# Patient Record
Sex: Female | Born: 1960
Health system: Southern US, Community
[De-identification: ages and names within clinical notes are randomized; demographics above are authoritative.]

## PROBLEM LIST (undated history)

## (undated) DIAGNOSIS — R002 Palpitations: Secondary | ICD-10-CM

## (undated) DIAGNOSIS — Z83719 Family history of colon polyps, unspecified: Secondary | ICD-10-CM

## (undated) DIAGNOSIS — E7800 Pure hypercholesterolemia, unspecified: Secondary | ICD-10-CM

## (undated) DIAGNOSIS — H409 Unspecified glaucoma: Secondary | ICD-10-CM

## (undated) DIAGNOSIS — I10 Essential (primary) hypertension: Secondary | ICD-10-CM

## (undated) DIAGNOSIS — M545 Low back pain, unspecified: Secondary | ICD-10-CM

## (undated) DIAGNOSIS — B001 Herpesviral vesicular dermatitis: Secondary | ICD-10-CM

## (undated) DIAGNOSIS — I493 Ventricular premature depolarization: Secondary | ICD-10-CM

## (undated) DIAGNOSIS — H40059 Ocular hypertension, unspecified eye: Secondary | ICD-10-CM

## (undated) DIAGNOSIS — K5900 Constipation, unspecified: Secondary | ICD-10-CM

## (undated) DIAGNOSIS — A630 Anogenital (venereal) warts: Secondary | ICD-10-CM

## (undated) DIAGNOSIS — E119 Type 2 diabetes mellitus without complications: Secondary | ICD-10-CM

## (undated) DIAGNOSIS — Z460 Encounter for fitting and adjustment of spectacles and contact lenses: Secondary | ICD-10-CM

## (undated) DIAGNOSIS — J45909 Unspecified asthma, uncomplicated: Secondary | ICD-10-CM

## (undated) DIAGNOSIS — M79606 Pain in leg, unspecified: Secondary | ICD-10-CM

## (undated) DIAGNOSIS — R0602 Shortness of breath: Secondary | ICD-10-CM

## (undated) DIAGNOSIS — E669 Obesity, unspecified: Secondary | ICD-10-CM

## (undated) DIAGNOSIS — R519 Headache, unspecified: Secondary | ICD-10-CM

## (undated) HISTORY — DX: Essential (primary) hypertension: I10

## (undated) HISTORY — PX: BREAST SURGERY: SHX581

## (undated) HISTORY — PX: COLPOSCOPY: SHX161

## (undated) HISTORY — DX: Palpitations: R00.2

## (undated) HISTORY — DX: Family history of colon polyps, unspecified: Z83.719

## (undated) HISTORY — DX: Constipation, unspecified: K59.00

## (undated) HISTORY — DX: Anogenital (venereal) warts: A63.0

## (undated) HISTORY — DX: Pain in leg, unspecified: M79.606

## (undated) HISTORY — PX: TONSILLECTOMY: SUR1361

## (undated) HISTORY — DX: Pure hypercholesterolemia, unspecified: E78.00

## (undated) HISTORY — DX: Herpesviral vesicular dermatitis: B00.1

## (undated) HISTORY — DX: Shortness of breath: R06.02

## (undated) HISTORY — DX: Low back pain, unspecified: M54.50

## (undated) HISTORY — DX: Obesity, unspecified: E66.9

## (undated) HISTORY — DX: Headache, unspecified: R51.9

## (undated) HISTORY — DX: Ventricular premature depolarization: I49.3

## (undated) HISTORY — DX: Ocular hypertension, unspecified eye: H40.059

## (undated) HISTORY — DX: Unspecified glaucoma: H40.9

## (undated) HISTORY — DX: Type 2 diabetes mellitus without complications: E11.9

## (undated) HISTORY — DX: Unspecified asthma, uncomplicated: J45.909

---

## 2002-06-21 HISTORY — PX: HAMMER TOE SURGERY: SHX385

## 2007-05-11 ENCOUNTER — Encounter: Admission: RE | Admit: 2007-05-11 | Discharge: 2007-05-11 | Payer: Self-pay | Admitting: Family Medicine

## 2007-06-22 HISTORY — PX: OTHER SURGICAL HISTORY: SHX169

## 2012-06-29 ENCOUNTER — Other Ambulatory Visit: Payer: Self-pay | Admitting: *Deleted

## 2012-06-29 DIAGNOSIS — I83893 Varicose veins of bilateral lower extremities with other complications: Secondary | ICD-10-CM

## 2012-06-29 DIAGNOSIS — M79609 Pain in unspecified limb: Secondary | ICD-10-CM

## 2012-07-25 ENCOUNTER — Encounter: Payer: Self-pay | Admitting: Vascular Surgery

## 2012-07-26 ENCOUNTER — Ambulatory Visit (INDEPENDENT_AMBULATORY_CARE_PROVIDER_SITE_OTHER): Payer: BC Managed Care – PPO | Admitting: Vascular Surgery

## 2012-07-26 ENCOUNTER — Encounter: Payer: Self-pay | Admitting: Vascular Surgery

## 2012-07-26 ENCOUNTER — Encounter (INDEPENDENT_AMBULATORY_CARE_PROVIDER_SITE_OTHER): Payer: BC Managed Care – PPO | Admitting: *Deleted

## 2012-07-26 VITALS — BP 129/77 | HR 72 | Resp 18 | Ht 64.0 in | Wt 190.7 lb

## 2012-07-26 DIAGNOSIS — M79609 Pain in unspecified limb: Secondary | ICD-10-CM

## 2012-07-26 DIAGNOSIS — I83893 Varicose veins of bilateral lower extremities with other complications: Secondary | ICD-10-CM

## 2012-07-26 DIAGNOSIS — I872 Venous insufficiency (chronic) (peripheral): Secondary | ICD-10-CM

## 2012-07-26 NOTE — Assessment & Plan Note (Signed)
The patient has evidence of mild bilateral chronic venous insufficiency. She has reflux in the saphenofemoral junction and proximal greater saphenous vein bilaterally. She has some mild deep venous reflux bilaterally also. We have discussed the importance of intermittent leg elevation and the proper positioning for this. I've also written her a prescription for some knee-high compression stockings with a mild gradient of 15-20 mm of mercury. I have encouraged her to avoid prolonged standing and to ambulate as much as possible. She does have some small spider veins and she could be considered for sclerotherapy if she would like and we'll have her talk to her vein nurse on the way out today. I will be happy to see her in the future if any new vascular issues arise. Reassure her that she has excellent arterial flow.

## 2012-07-26 NOTE — Progress Notes (Signed)
Vascular and Vein Specialist of [facility]  Patient name: Brittney Gibson MRN: [id number] DOB: [birth year] Sex: female  REASON FOR CONSULT: chronic venous insufficiency with varicose vein  HPI: Brittney Gibson is a 52 y.o. female who states that she has had a long history of bilateral lower extremity varicose veins. Over the last several months these have gradually worsened. She experiences aching pain in her legs and heaviness which is aggravated by standing and relieved somewhat with elevation. She has not worn compression stockings. She has no history of DVT or phlebitis. She does a lot of sitting at work.  Her past medical history is otherwise fairly unremarkable. She has no history of cardiac or pulmonary disease. She denies any history of diabetes. She does have a history of hypertension.  Past Medical History  Diagnosis Date  . Hypertension   . Leg pain   . Asthma    History reviewed. No pertinent family history. Her mother had varicose veins.  SOCIAL HISTORY: History  Substance Use Topics  . Smoking status: Never Smoker   . Smokeless tobacco: Not on file  . Alcohol Use: Yes     Comment: social drinker    Allergies  Allergen Reactions  . Codeine     Difficulty breathing    Current Outpatient Prescriptions  Medication Sig Dispense Refill  . bimatoprost (LUMIGAN) 0.01 % SOLN 1 drop at bedtime.      Marland Kitchen lisinopril-hydrochlorothiazide (PRINZIDE,ZESTORETIC) 20-25 MG per tablet Take 1 tablet by mouth daily.        REVIEW OF SYSTEMS: Arly.Keller ] denotes positive finding; [  ] denotes negative finding  CARDIOVASCULAR:  [ ]  chest pain   [ ]  chest pressure   [ ]  palpitations   [ ]  orthopnea   [ ]  dyspnea on exertion   [ ]  claudication   [ ]  rest pain   [ ]  DVT   [ ]  phlebitis PULMONARY:   [ ]  productive cough   [ ]  asthma   [ ]  wheezing NEUROLOGIC:   [ ]  weakness  [ ]  paresthesias  [ ]  aphasia  [ ]  amaurosis  [ ]  dizziness HEMATOLOGIC:   [ ]  bleeding problems   [ ]  clotting  disorders MUSCULOSKELETAL:  [ ]  joint pain   [ ]  joint swelling [ ]  leg swelling GASTROINTESTINAL: [ ]   blood in stool  [ ]   hematemesis GENITOURINARY:  [ ]   dysuria  [ ]   hematuria PSYCHIATRIC:  [ ]  history of major depression INTEGUMENTARY:  [ ]  rashes  [ ]  ulcers CONSTITUTIONAL:  [ ]  fever   [ ]  chills  PHYSICAL EXAM: Filed Vitals:   07/26/12 1317  BP: 129/77  Pulse: 72  Resp: 18  Height: 5\' 4"  (1.626 m)  Weight: 190 lb 11.2 oz (86.501 kg)   Body mass index is 32.73 kg/(m^2). GENERAL: The patient is a well-nourished female, in no acute distress. The vital signs are documented above. CARDIOVASCULAR: There is a regular rate and rhythm. Do not detect carotid bruits. She has palpable popliteal and pedal pulses bilaterally. PULMONARY: There is good air exchange bilaterally without wheezing or rales. ABDOMEN: Soft and non-tender with normal pitched bowel sounds.  MUSCULOSKELETAL: There are no major deformities or cyanosis. NEUROLOGIC: No focal weakness or paresthesias are detected. SKIN: There are no ulcers or rashes noted.she has spider veins bilaterally but no significant truncal varicosities. PSYCHIATRIC: The patient has a normal affect.  DATA:  I have independently interpreted her venous duplex scan. She has reflux in  the saphenofemoral junctions and proximal greater saphenous veins bilaterally. She has mild deep venous reflux. In the left side she does has reflux in the lesser saphenous vein.  MEDICAL ISSUES:  Venous insufficiency The patient has evidence of mild bilateral chronic venous insufficiency. She has reflux in the saphenofemoral junction and proximal greater saphenous vein bilaterally. She has some mild deep venous reflux bilaterally also. We have discussed the importance of intermittent leg elevation and the proper positioning for this. I've also written her a prescription for some knee-high compression stockings with a mild gradient of 15-20 mm of mercury. I have  encouraged her to avoid prolonged standing and to ambulate as much as possible. She does have some small spider veins and she could be considered for sclerotherapy if she would like and we'll have her talk to her vein nurse on the way out today. I will be happy to see her in the future if any new vascular issues arise. Reassure her that she has excellent arterial flow.   DICKSON,CHRISTOPHER S [facility] Specialists of [facility]: [phone]

## 2012-08-30 ENCOUNTER — Encounter: Payer: Self-pay | Admitting: *Deleted

## 2012-08-31 ENCOUNTER — Ambulatory Visit (INDEPENDENT_AMBULATORY_CARE_PROVIDER_SITE_OTHER): Payer: BC Managed Care – PPO | Admitting: *Deleted

## 2012-08-31 DIAGNOSIS — I781 Nevus, non-neoplastic: Secondary | ICD-10-CM

## 2012-08-31 NOTE — Progress Notes (Signed)
X=.3% Sotradecol administered with a 27g butterfly.  Patient received a total of 6cc.  Not able to treat all her veins. She only wanted to use one syringe. Did get the ones that bother her the most. She does have some reflux but the diameters are small. I explained about reflux to her and warned that she will probably get more spiders/vv's in the future. Hope for results she is pleased with. Easy access.She was nervous about needles. Will follow prn  Photos: yes  Compression stockings applied: yes

## 2012-09-05 ENCOUNTER — Encounter: Payer: Self-pay | Admitting: Vascular Surgery

## 2012-09-06 ENCOUNTER — Ambulatory Visit: Payer: BC Managed Care – PPO | Admitting: *Deleted

## 2012-11-19 HISTORY — PX: ORIF WRIST FRACTURE: SHX2133

## 2012-11-24 ENCOUNTER — Emergency Department (HOSPITAL_COMMUNITY): Payer: BC Managed Care – PPO

## 2012-11-24 ENCOUNTER — Encounter (HOSPITAL_COMMUNITY): Payer: Self-pay | Admitting: Emergency Medicine

## 2012-11-24 ENCOUNTER — Emergency Department (HOSPITAL_COMMUNITY)
Admission: EM | Admit: 2012-11-24 | Discharge: 2012-11-24 | Disposition: A | Discharge status: Home / Self Care | Payer: BC Managed Care – PPO | Attending: Emergency Medicine | Admitting: Emergency Medicine

## 2012-11-24 DIAGNOSIS — I10 Essential (primary) hypertension: Secondary | ICD-10-CM | POA: Insufficient documentation

## 2012-11-24 DIAGNOSIS — W010XXA Fall on same level from slipping, tripping and stumbling without subsequent striking against object, initial encounter: Secondary | ICD-10-CM | POA: Insufficient documentation

## 2012-11-24 DIAGNOSIS — J45909 Unspecified asthma, uncomplicated: Secondary | ICD-10-CM | POA: Insufficient documentation

## 2012-11-24 DIAGNOSIS — Y9302 Activity, running: Secondary | ICD-10-CM | POA: Insufficient documentation

## 2012-11-24 DIAGNOSIS — S52599A Other fractures of lower end of unspecified radius, initial encounter for closed fracture: Secondary | ICD-10-CM | POA: Insufficient documentation

## 2012-11-24 DIAGNOSIS — Z79899 Other long term (current) drug therapy: Secondary | ICD-10-CM | POA: Insufficient documentation

## 2012-11-24 DIAGNOSIS — Y929 Unspecified place or not applicable: Secondary | ICD-10-CM | POA: Insufficient documentation

## 2012-11-24 LAB — CBC WITH DIFFERENTIAL/PLATELET
Basophils Absolute: 0 10*3/uL (ref 0.0–0.1)
Basophils Relative: 0 % (ref 0–1)
Eosinophils Absolute: 0.1 10*3/uL (ref 0.0–0.7)
Eosinophils Relative: 1 % (ref 0–5)
Lymphs Abs: 3.9 10*3/uL (ref 0.7–4.0)
MCH: 29.7 pg (ref 26.0–34.0)
MCHC: 34 g/dL (ref 30.0–36.0)
MCV: 87.5 fL (ref 78.0–100.0)
Monocytes Relative: 8 % (ref 3–12)
Neutro Abs: 4.2 10*3/uL (ref 1.7–7.7)
Neutrophils Relative %: 47 % (ref 43–77)
RBC: 4.31 MIL/uL (ref 3.87–5.11)

## 2012-11-24 LAB — BASIC METABOLIC PANEL
BUN: 17 mg/dL (ref 6–23)
Calcium: 9.8 mg/dL (ref 8.4–10.5)
Glucose, Bld: 172 mg/dL — ABNORMAL HIGH (ref 70–99)
Potassium: 3.4 mEq/L — ABNORMAL LOW (ref 3.5–5.1)

## 2012-11-24 MED ORDER — OXYCODONE-ACETAMINOPHEN 5-325 MG PO TABS
2.0000 | ORAL_TABLET | Freq: Once | ORAL | Status: AC
  Administered 2012-11-24: 2 via ORAL
  Filled 2012-11-24: qty 2

## 2012-11-24 MED ORDER — HYDROMORPHONE HCL PF 1 MG/ML IJ SOLN
1.0000 mg | Freq: Once | INTRAMUSCULAR | Status: AC
  Administered 2012-11-24: 1 mg via INTRAVENOUS
  Filled 2012-11-24: qty 1

## 2012-11-24 MED ORDER — SODIUM CHLORIDE 0.9 % IV SOLN
Freq: Once | INTRAVENOUS | Status: AC
  Administered 2012-11-24: 21:00:00 via INTRAVENOUS

## 2012-11-24 MED ORDER — HYDROMORPHONE HCL 4 MG PO TABS: 4.0000 mg | ORAL_TABLET | Freq: Four times a day (QID) | ORAL | Status: DC | PRN

## 2012-11-24 MED ORDER — ONDANSETRON HCL 4 MG/2ML IJ SOLN
4.0000 mg | Freq: Once | INTRAMUSCULAR | Status: AC
  Administered 2012-11-24: 4 mg via INTRAVENOUS
  Filled 2012-11-24: qty 2

## 2012-11-24 NOTE — ED Notes (Addendum)
Pt reports a bee was chasing her approx 30 min ago and she tripped on concrete while running. C/o pain, swelling, and deformity to L wrist.

## 2012-11-24 NOTE — Consult Note (Signed)
[id number] job id dictated

## 2012-11-24 NOTE — Progress Notes (Signed)
Orthopedic Tech Progress Note Patient Details:  Brittney Gibson 1960/07/07 [id number]  Ortho Devices Type of Ortho Device: Ace wrap;Sugartong splint;Arm sling Ortho Device/Splint Location: LUE Ortho Device/Splint Interventions: Ordered;Application   [name] 11/24/2012, 10:28 PM

## 2012-11-24 NOTE — ED Provider Notes (Signed)
History  This chart was scribed for Brittney Pel, MS, PA-C working with Brittney Kaplan, MD by Brittney Gibson, ED Scribe. This patient was seen in room TR06C/TR06C and the patient's care was started at 8:20 PM.   CSN: [id number]  Arrival date & time 11/24/12  2015    Chief Complaint  Patient presents with  . Wrist Pain     The history is provided by the patient. No language interpreter was used.    HPI Comments: Brittney Gibson is a 52 y.o. female with a h/o HTN who presents to the Emergency Department complaining of constant, severe left wrist pain with associated swelling onset after an accidental fall that occurred about 30 minutes ago. Pt states that she tripped and fell while running away from a bee and landed on her left wrist. Obvious deformity. Pt denies head injury, LOC, back pain, neck pain, fever, chills, nausea, diarrhea or any other symptoms.  Past Medical History  Diagnosis Date  . Hypertension   . Leg pain   . Asthma     Past Surgical History  Procedure Laterality Date  . Tonsillectomy    . Hammer toe surgery  2004    bilateral  . Cataract surgery  2009    right eye    No family history on file.  History  Substance Use Topics  . Smoking status: Never Smoker   . Smokeless tobacco: Not on file  . Alcohol Use: Yes     Comment: social drinker    OB History   Grav Para Term Preterm Abortions TAB SAB Ect Mult Living                  Review of Systems  Constitutional: Negative for fever and chills.  Gastrointestinal: Negative for nausea, vomiting and diarrhea.  Musculoskeletal:       Left wrist pain.  Neurological: Negative for syncope.   A complete 10 system review of systems was obtained and all systems are negative except as noted in the HPI and PMH.   Allergies  Codeine  Home Medications   Current Outpatient Rx  Name  Route  Sig  Dispense  Refill  . bimatoprost (LUMIGAN) 0.01 % SOLN      1 drop at bedtime.         Marland Kitchen  lisinopril-hydrochlorothiazide (PRINZIDE,ZESTORETIC) 20-25 MG per tablet   Oral   Take 1 tablet by mouth daily.           Triage Vitals: BP 144/91  Pulse 108  Temp(Src) 98.2 F (36.8 C) (Oral)  Resp 24  SpO2 94%  Physical Exam  Nursing note and vitals reviewed. Constitutional: She is oriented to person, place, and time. She appears well-developed and well-nourished. No distress.  HENT:  Head: Normocephalic and atraumatic.  Eyes: EOM are normal.  Neck: Normal range of motion.  Cardiovascular: Normal rate, regular rhythm and normal heart sounds.   Pulmonary/Chest: Effort normal and breath sounds normal.  Abdominal: Soft. She exhibits no distension. There is no tenderness.  Musculoskeletal:       Left wrist: She exhibits decreased range of motion, tenderness, bony tenderness, swelling, crepitus and deformity.  Grip strength is physiologic with pain. Cap refill is < 3 seconds  Neurological: She is alert and oriented to person, place, and time.  Skin: Skin is warm and dry.  Psychiatric: She has a normal mood and affect. Judgment normal.    ED Course  Procedures (including critical care time)  DIAGNOSTIC STUDIES: Oxygen Saturation is  94% on RA, normal by my interpretation.    COORDINATION OF CARE: 8:29 PM- Pt advised of plan for treatment and pt agrees.   Medications  0.9 %  sodium chloride infusion ( Intravenous New Bag/Given 11/24/12 2040)  HYDROmorphone (DILAUDID) injection 1 mg (1 mg Intravenous Given 11/24/12 2034)  ondansetron (ZOFRAN) injection 4 mg (4 mg Intravenous Given 11/24/12 2034)  HYDROmorphone (DILAUDID) injection 1 mg (1 mg Intravenous Given 11/24/12 2201)     Labs Reviewed  BASIC METABOLIC PANEL - Abnormal; Notable for the following:    Potassium 3.4 (*)    Glucose, Bld 172 (*)    GFR calc non Af Amer 82 (*)    All other components within normal limits  CBC WITH DIFFERENTIAL   Dg Wrist 2 Views Left  11/24/2012   *RADIOLOGY REPORT*  Clinical Data:  Complaining of left wrist pain.  LEFT WRIST - 2 VIEW  Comparison: No priors.  Findings: Two views of the left wrist demonstrate an acute mildly comminuted intra-articular fracture of the distal left radius which appears mildly impacted and angulated (approximately 30 degrees of volar angulation).  Overlying soft tissues are markedly swollen. Distal ulna is intact.  Carpals appear intact, although the study is suboptimal related to nonstandard views.  IMPRESSION: 1.  Distal left radial fracture, as above.   Original Report Authenticated By: Brittney Gibson, M.D.     1. Wrist fracture, closed, left, closed, initial encounter       MDM  Dr. Melvyn Gibson came to see patient in the ED and reduce fracture in the exam room with my assistance and the ortho tech.  The patient is going to [place] this Sunday, Dr. Melvyn Gibson is aware, and will go to surgery as soon as she gets back. Dr. Melvyn Gibson requests I give her Dilaudid PO and enough for the whole week she is gone. I discussed this with Dr. Rhunette Gibson and he is aware that I will be writing this prescription.   52 y.o.Marin Gibson's evaluation in the Emergency Department is complete. It has been determined that no acute conditions requiring further emergency intervention are present at this time. The patient/guardian have been advised of the diagnosis and plan. We have discussed signs and symptoms that warrant return to the ED, such as changes or worsening in symptoms.  Vital signs are stable at discharge. Filed Vitals:   11/24/12 2200  BP: 173/88  Pulse: 97  Temp:   Resp: 16    Patient/guardian has voiced understanding and agreed to follow-up with the PCP or specialist.     Brittney Matas, PA-C 11/24/12 2245

## 2012-11-24 NOTE — ED Notes (Signed)
Returned from xray

## 2012-11-24 NOTE — ED Notes (Signed)
Patient transported to X-ray 

## 2012-11-25 ENCOUNTER — Telehealth (HOSPITAL_COMMUNITY): Payer: Self-pay | Admitting: Emergency Medicine

## 2012-11-25 NOTE — Consult Note (Signed)
NAMESAMARY, SHATZ NO.:  [id number]  MEDICAL RECORD NO.:  [id number]  LOCATION:  A12C                         FACILITY:  [facility]  PHYSICIAN:  Madelynn Done, MD  DATE OF BIRTH:  May 22, 1961  DATE OF CONSULTATION:  11/24/2012 DATE OF DISCHARGE:  11/24/2012                                CONSULTATION   REQUESTING PHYSICIAN:  Ellin Saba, PA-C Emergency Department.  REASON FOR CONSULTATION:  Left wrist distal radius fracture.  BRIEF HISTORY:  Ms. Goecke is a 52 year old female right-hand dominant, who was walking and fell on an outstretched left wrist.  The patient fell while running away from a Bee, landing on an outstretched left wrist.  The patient denies any chest pain, loss of consciousness.  No other symptoms.  No previous injury to the left wrist.  Her past medical history, past surgical history, medications, allergies, social history, and review of systems as noted in the ER physician record and reviewed.  ALLERGIES:  CODEINE.  PHYSICAL EXAMINATION:  She is a healthy-appearing female.  Height and weight listed in the computer.  She has a good hand coordination of right hand.  Normal mood.  She is alert and oriented to person, place, and time, in no acute distress.  On examination of left upper extremity, the patient's skin shows a mild soft tissue abrasion directly over the dorsal ulnar aspect of the wrist.  She does have a prominence of the distal ulna.  She does have the obvious deformity in the distal radius. She is able to make the okay sign, cross her fingers, extend her thumb, extend her digits.  Her fingertips are warm, well perfused.  Good capillary refill.  Good blood flow.  She does have limitations in her elbow, wrist, and forearm mobility secondary to her injury.  Her radiographs were reviewed which did show the displaced and angulated volarly displaced distal radius fracture with the intra-articular extension, volar Barton's type  fracture pattern.  IMPRESSION:  Left wrist intra-articular distal radius fracture displaced.  PROCEDURE NOTE:  After talking with Ms. Molla in detail the patient does have a little bit of predicament that [place] is going out of [place] 2 [place].  Given this predicament, I would not recommend operative intervention or stabilization which the patient is going to need prior to her leaving the country.  We talked about performing closed manipulation and after 18 mL lidocaine hematoma block was then performed, the patient was placed in a finger trap traction and a closed manipulation was then performed.  The patient tolerated this well.  The patient was placed in a well-padded sugar-tong splint after manipulation.  POSTPROCEDURE PLAN:  The patient is going to be discharged to home.  The patient is going to be discharged on oral pain medications.  I plan to see her back likely for the outpatient surgical intervention for ORIF of the distal radius as an outpatient.  We talked about the procedure.  We talked about the reason and the rationale for the surgical intervention to overall correct the position of the distal radius and maintain the overall anatomical alignment.  The patient is going to be placed and continue with the  sugar-tong splint, ice, activity modifications, elevation.  The patient has my cell phone number and contact information should any issues or concerns arise, we will schedule this as an outpatient in the coming week.  All questions were answered.  Encouraged Ms. Jeffreys today.  Prescriptions and followup instructions were given by the emergency department.     Madelynn Done, MD     FWO/MEDQ  D:  11/24/2012  T:  11/25/2012  Job:  [id number]

## 2012-11-29 NOTE — ED Provider Notes (Signed)
Medical screening examination/treatment/procedure(s) were conducted as a shared visit with non-physician practitioner(s) and myself.  I personally evaluated the patient during the encounter  Derwood Kaplan, MD 11/29/12 [phone]

## 2013-06-26 ENCOUNTER — Ambulatory Visit (INDEPENDENT_AMBULATORY_CARE_PROVIDER_SITE_OTHER): Payer: BC Managed Care – PPO | Admitting: Surgery

## 2013-06-26 ENCOUNTER — Encounter (INDEPENDENT_AMBULATORY_CARE_PROVIDER_SITE_OTHER): Payer: Self-pay | Admitting: Surgery

## 2013-06-26 ENCOUNTER — Encounter (INDEPENDENT_AMBULATORY_CARE_PROVIDER_SITE_OTHER): Payer: Self-pay

## 2013-06-26 VITALS — BP 118/74 | HR 71 | Temp 97.8°F | Resp 16 | Ht 64.0 in | Wt 195.8 lb

## 2013-06-26 DIAGNOSIS — D242 Benign neoplasm of left breast: Secondary | ICD-10-CM | POA: Insufficient documentation

## 2013-06-26 DIAGNOSIS — D249 Benign neoplasm of unspecified breast: Secondary | ICD-10-CM | POA: Insufficient documentation

## 2013-06-26 NOTE — Progress Notes (Signed)
Patient ID: Brittney Gibson, female   DOB: 09-02-1960, 53 y.o.   MRN: [id number]  Chief Complaint  Patient presents with  . Mass    Evaluate breast papilloma    HPI Brittney Gibson is a 53 y.o. female.   HPI This is a pleasant female referred by Dr. Loni Muse. Hawkins for evaluation of a recent diagnosis of left breast papilloma. She had sudden bloody nipple discharge from the left breast several months ago. It then recurred prompting an ultrasound and mammogram. She then had a biopsy of a small mass with pathology showing intraductal papilloma. She has no previous history of problems with her breast. Her mother had ductal carcinoma in situ of the breast in her 58s. She is without complaints Past Medical History  Diagnosis Date  . Hypertension   . Leg pain   . Asthma     Past Surgical History  Procedure Laterality Date  . Tonsillectomy    . Hammer toe surgery  2004    bilateral  . Cataract surgery  2009    right eye    History reviewed. No pertinent family history.  Social History History  Substance Use Topics  . Smoking status: Never Smoker   . Smokeless tobacco: Not on file  . Alcohol Use: Yes     Comment: social drinker    Allergies  Allergen Reactions  . Codeine Anaphylaxis    Current Outpatient Prescriptions  Medication Sig Dispense Refill  . bimatoprost (LUMIGAN) 0.01 % SOLN 1 drop at bedtime.      Marland Kitchen lisinopril-hydrochlorothiazide (PRINZIDE,ZESTORETIC) 20-25 MG per tablet Take 1 tablet by mouth daily.       No current facility-administered medications for this visit.    Review of Systems Review of Systems  Constitutional: Negative for fever, chills and unexpected weight change.  HENT: Negative for congestion, hearing loss, sore throat, trouble swallowing and voice change.   Eyes: Negative for visual disturbance.  Respiratory: Negative for cough and wheezing.   Cardiovascular: Negative for chest pain, palpitations and leg swelling.  Gastrointestinal: Negative for  nausea, vomiting, abdominal pain, diarrhea, constipation, blood in stool, abdominal distention and anal bleeding.  Genitourinary: Negative for hematuria, vaginal bleeding and difficulty urinating.  Musculoskeletal: Negative for arthralgias.  Skin: Negative for rash and wound.  Neurological: Negative for seizures, syncope and headaches.  Hematological: Negative for adenopathy. Does not bruise/bleed easily.  Psychiatric/Behavioral: Negative for confusion.    Blood pressure 118/74, pulse 71, temperature 97.8 F (36.6 C), temperature source Temporal, resp. rate 16, height 5\' 4"  (1.626 m), weight 195 lb 12.8 oz (88.814 kg).  Physical Exam Physical Exam  Constitutional: She is oriented to person, place, and time. She appears well-developed and well-nourished. No distress.  HENT:  Head: Normocephalic and atraumatic.  Right Ear: External ear normal.  Left Ear: External ear normal.  Nose: Nose normal.  Mouth/Throat: Oropharynx is clear and moist.  Eyes: Conjunctivae are normal. Pupils are equal, round, and reactive to light. Right eye exhibits no discharge. Left eye exhibits no discharge. No scleral icterus.  Neck: Normal range of motion. Neck supple. No tracheal deviation present. No thyromegaly present.  Cardiovascular: Normal rate, regular rhythm, normal heart sounds and intact distal pulses.   No murmur heard. Pulmonary/Chest: Effort normal and breath sounds normal. No respiratory distress. She has no wheezes. She has no rales.  Musculoskeletal: Normal range of motion. She exhibits no edema and no tenderness.  Lymphadenopathy:    She has no cervical adenopathy.    She has no  axillary adenopathy.  Neurological: She is alert and oriented to person, place, and time.  Skin: Skin is warm and dry. No rash noted. She is not diaphoretic. No erythema.  Psychiatric: Her behavior is normal. Judgment normal.  Breasts: There are no palpable masses in either breast.  Data Reviewed I had a mammogram  and ultrasound. This shows a 1.6 cm x 0.4 cm intraductal mass of the left breast. The biopsy shows a sclerosing intraductal papilloma  Assessment    Left breast papilloma     Plan    Needle localized lumpectomy is recommended given the pathology serious have discussed this with her in detail. I discussed the surgical procedure  including the risks.  These risks include but are not limited to bleeding, infection, need for further surgery should malignancy be present, et Ronney Asters. She understands and wished to proceed. Surgery will be scheduled.       [name] A 06/26/2013, 3:43 PM

## 2013-06-28 ENCOUNTER — Encounter (INDEPENDENT_AMBULATORY_CARE_PROVIDER_SITE_OTHER): Payer: Self-pay

## 2013-06-29 ENCOUNTER — Encounter (HOSPITAL_BASED_OUTPATIENT_CLINIC_OR_DEPARTMENT_OTHER): Payer: Self-pay | Admitting: *Deleted

## 2013-06-29 NOTE — Progress Notes (Signed)
To come in ekg bmet-had orif wrist 6/14 [place] no ekg done

## 2013-07-04 ENCOUNTER — Encounter (HOSPITAL_BASED_OUTPATIENT_CLINIC_OR_DEPARTMENT_OTHER)
Admission: RE | Admit: 2013-07-04 | Discharge: 2013-07-04 | Disposition: A | Discharge status: Home / Self Care | Payer: BC Managed Care – PPO | Source: Ambulatory Visit | Attending: Surgery | Admitting: Surgery

## 2013-07-04 LAB — BASIC METABOLIC PANEL
BUN: 15 mg/dL (ref 6–23)
CO2: 29 mEq/L (ref 19–32)
Calcium: 9.7 mg/dL (ref 8.4–10.5)
Chloride: 102 mEq/L (ref 96–112)
Creatinine, Ser: 0.76 mg/dL (ref 0.50–1.10)
Glucose, Bld: 127 mg/dL — ABNORMAL HIGH (ref 70–99)
Potassium: 3.6 mEq/L — ABNORMAL LOW (ref 3.7–5.3)
Sodium: 144 mEq/L (ref 137–147)

## 2013-07-05 ENCOUNTER — Encounter (HOSPITAL_BASED_OUTPATIENT_CLINIC_OR_DEPARTMENT_OTHER): Payer: Self-pay | Admitting: Anesthesiology

## 2013-07-05 ENCOUNTER — Encounter (HOSPITAL_BASED_OUTPATIENT_CLINIC_OR_DEPARTMENT_OTHER)
Admission: RE | Disposition: A | Discharge status: Home / Self Care | Payer: Self-pay | Source: Ambulatory Visit | Attending: Surgery

## 2013-07-05 ENCOUNTER — Ambulatory Visit (HOSPITAL_BASED_OUTPATIENT_CLINIC_OR_DEPARTMENT_OTHER): Payer: BC Managed Care – PPO | Admitting: Anesthesiology

## 2013-07-05 ENCOUNTER — Encounter (HOSPITAL_BASED_OUTPATIENT_CLINIC_OR_DEPARTMENT_OTHER): Payer: BC Managed Care – PPO | Admitting: Anesthesiology

## 2013-07-05 ENCOUNTER — Ambulatory Visit (HOSPITAL_BASED_OUTPATIENT_CLINIC_OR_DEPARTMENT_OTHER)
Admission: RE | Admit: 2013-07-05 | Discharge: 2013-07-05 | Disposition: A | Discharge status: Home / Self Care | Payer: BC Managed Care – PPO | Source: Ambulatory Visit | Attending: Surgery | Admitting: Surgery

## 2013-07-05 DIAGNOSIS — D249 Benign neoplasm of unspecified breast: Secondary | ICD-10-CM

## 2013-07-05 DIAGNOSIS — N6089 Other benign mammary dysplasias of unspecified breast: Secondary | ICD-10-CM

## 2013-07-05 DIAGNOSIS — I10 Essential (primary) hypertension: Secondary | ICD-10-CM | POA: Insufficient documentation

## 2013-07-05 DIAGNOSIS — D242 Benign neoplasm of left breast: Secondary | ICD-10-CM

## 2013-07-05 DIAGNOSIS — J45909 Unspecified asthma, uncomplicated: Secondary | ICD-10-CM | POA: Insufficient documentation

## 2013-07-05 DIAGNOSIS — Z01812 Encounter for preprocedural laboratory examination: Secondary | ICD-10-CM | POA: Insufficient documentation

## 2013-07-05 HISTORY — PX: BREAST LUMPECTOMY WITH NEEDLE LOCALIZATION: SHX5759

## 2013-07-05 HISTORY — DX: Encounter for fitting and adjustment of spectacles and contact lenses: Z46.0

## 2013-07-05 SURGERY — BREAST LUMPECTOMY WITH NEEDLE LOCALIZATION
Anesthesia: General | Site: Breast | Laterality: Left

## 2013-07-05 MED ORDER — HYDROCODONE-ACETAMINOPHEN 5-325 MG PO TABS: 1.0000 | ORAL_TABLET | ORAL | Status: DC | PRN

## 2013-07-05 MED ORDER — FENTANYL CITRATE 0.05 MG/ML IJ SOLN
50.0000 ug | INTRAMUSCULAR | Status: DC | PRN
Start: 2013-07-05 — End: 2013-07-05

## 2013-07-05 MED ORDER — CEFAZOLIN SODIUM-DEXTROSE 2-3 GM-% IV SOLR
INTRAVENOUS | Status: AC
  Filled 2013-07-05: qty 50

## 2013-07-05 MED ORDER — LIDOCAINE HCL (CARDIAC) 20 MG/ML IV SOLN
INTRAVENOUS | Status: DC | PRN
  Administered 2013-07-05: 25 mg via INTRAVENOUS
  Administered 2013-07-05: 75 mg via INTRAVENOUS

## 2013-07-05 MED ORDER — PROPOFOL 10 MG/ML IV BOLUS
INTRAVENOUS | Status: DC | PRN
  Administered 2013-07-05: 200 mg via INTRAVENOUS

## 2013-07-05 MED ORDER — ONDANSETRON HCL 4 MG/2ML IJ SOLN
INTRAMUSCULAR | Status: DC | PRN
  Administered 2013-07-05: 4 mg via INTRAVENOUS

## 2013-07-05 MED ORDER — CEFAZOLIN SODIUM-DEXTROSE 2-3 GM-% IV SOLR
2.0000 g | INTRAVENOUS | Status: AC
  Administered 2013-07-05: 2 g via INTRAVENOUS

## 2013-07-05 MED ORDER — HYDROMORPHONE HCL PF 1 MG/ML IJ SOLN
0.2500 mg | INTRAMUSCULAR | Status: DC | PRN
  Administered 2013-07-05 (×2): 0.5 mg via INTRAVENOUS

## 2013-07-05 MED ORDER — MIDAZOLAM HCL 5 MG/5ML IJ SOLN
INTRAMUSCULAR | Status: DC | PRN
  Administered 2013-07-05: 2 mg via INTRAVENOUS

## 2013-07-05 MED ORDER — HYDROMORPHONE HCL PF 1 MG/ML IJ SOLN
INTRAMUSCULAR | Status: AC
  Filled 2013-07-05: qty 1

## 2013-07-05 MED ORDER — BUPIVACAINE-EPINEPHRINE 0.5% -1:200000 IJ SOLN
INTRAMUSCULAR | Status: DC | PRN
  Administered 2013-07-05: 10 mL

## 2013-07-05 MED ORDER — DEXAMETHASONE SODIUM PHOSPHATE 4 MG/ML IJ SOLN
INTRAMUSCULAR | Status: DC | PRN
  Administered 2013-07-05: 10 mg via INTRAVENOUS

## 2013-07-05 MED ORDER — FENTANYL CITRATE 0.05 MG/ML IJ SOLN
INTRAMUSCULAR | Status: AC
Start: 2013-07-05 — End: 2013-07-05
  Filled 2013-07-05: qty 6

## 2013-07-05 MED ORDER — OXYCODONE HCL 5 MG/5ML PO SOLN: 5.0000 mg | Freq: Once | ORAL | Status: AC | PRN

## 2013-07-05 MED ORDER — LACTATED RINGERS IV SOLN
INTRAVENOUS | Status: DC
  Administered 2013-07-05 (×2): via INTRAVENOUS

## 2013-07-05 MED ORDER — KETOROLAC TROMETHAMINE 30 MG/ML IJ SOLN
INTRAMUSCULAR | Status: DC | PRN
  Administered 2013-07-05: 30 mg via INTRAVENOUS

## 2013-07-05 MED ORDER — MIDAZOLAM HCL 2 MG/2ML IJ SOLN
INTRAMUSCULAR | Status: AC
  Filled 2013-07-05: qty 2

## 2013-07-05 MED ORDER — OXYCODONE HCL 5 MG PO TABS
5.0000 mg | ORAL_TABLET | Freq: Once | ORAL | Status: AC | PRN
  Administered 2013-07-05: 5 mg via ORAL
  Filled 2013-07-05: qty 1

## 2013-07-05 MED ORDER — FENTANYL CITRATE 0.05 MG/ML IJ SOLN
INTRAMUSCULAR | Status: DC | PRN
  Administered 2013-07-05: 100 ug via INTRAVENOUS

## 2013-07-05 MED ORDER — MIDAZOLAM HCL 2 MG/2ML IJ SOLN: 1.0000 mg | INTRAMUSCULAR | Status: DC | PRN

## 2013-07-05 SURGICAL SUPPLY — 45 items
BENZOIN TINCTURE PRP APPL 2/3 (GAUZE/BANDAGES/DRESSINGS) ×2 IMPLANT
BLADE HEX COATED 2.75 (ELECTRODE) ×2 IMPLANT
BLADE SURG 15 STRL LF DISP TIS (BLADE) ×1 IMPLANT
BLADE SURG 15 STRL SS (BLADE) ×1
CANISTER SUCT 1200ML W/VALVE (MISCELLANEOUS) ×2 IMPLANT
CHLORAPREP W/TINT 26ML (MISCELLANEOUS) ×2 IMPLANT
CLIP TI WIDE RED SMALL 6 (CLIP) IMPLANT
COVER MAYO STAND STRL (DRAPES) ×2 IMPLANT
COVER TABLE BACK 60X90 (DRAPES) ×2 IMPLANT
DECANTER SPIKE VIAL GLASS SM (MISCELLANEOUS) ×2 IMPLANT
DEVICE DUBIN W/COMP PLATE 8390 (MISCELLANEOUS) ×2 IMPLANT
DRAPE PED LAPAROTOMY (DRAPES) ×2 IMPLANT
DRAPE UTILITY XL STRL (DRAPES) ×2 IMPLANT
DRSG TEGADERM 4X4.75 (GAUZE/BANDAGES/DRESSINGS) ×2 IMPLANT
ELECT REM PT RETURN 9FT ADLT (ELECTROSURGICAL) ×2
ELECTRODE REM PT RTRN 9FT ADLT (ELECTROSURGICAL) ×1 IMPLANT
GLOVE BIOGEL PI IND STRL 7.0 (GLOVE) ×1 IMPLANT
GLOVE BIOGEL PI IND STRL 7.5 (GLOVE) ×1 IMPLANT
GLOVE BIOGEL PI INDICATOR 7.0 (GLOVE) ×1
GLOVE BIOGEL PI INDICATOR 7.5 (GLOVE) ×1
GLOVE ECLIPSE 7.0 STRL STRAW (GLOVE) ×2 IMPLANT
GLOVE SURG SIGNA 7.5 PF LTX (GLOVE) ×2 IMPLANT
GLOVE SURG SS PI 7.0 STRL IVOR (GLOVE) ×2 IMPLANT
GOWN STRL REUS W/ TWL LRG LVL3 (GOWN DISPOSABLE) ×1 IMPLANT
GOWN STRL REUS W/ TWL XL LVL3 (GOWN DISPOSABLE) ×1 IMPLANT
GOWN STRL REUS W/TWL LRG LVL3 (GOWN DISPOSABLE) ×1
GOWN STRL REUS W/TWL XL LVL3 (GOWN DISPOSABLE) ×1
KIT MARKER MARGIN INK (KITS) ×2 IMPLANT
NEEDLE HYPO 25X1 1.5 SAFETY (NEEDLE) ×2 IMPLANT
NS IRRIG 1000ML POUR BTL (IV SOLUTION) ×2 IMPLANT
PACK BASIN DAY SURGERY FS (CUSTOM PROCEDURE TRAY) ×2 IMPLANT
PENCIL BUTTON HOLSTER BLD 10FT (ELECTRODE) ×2 IMPLANT
SLEEVE SCD COMPRESS KNEE MED (MISCELLANEOUS) ×2 IMPLANT
SPONGE GAUZE 4X4 12PLY STER LF (GAUZE/BANDAGES/DRESSINGS) ×2 IMPLANT
SPONGE LAP 4X18 X RAY DECT (DISPOSABLE) ×2 IMPLANT
STRIP CLOSURE SKIN 1/2X4 (GAUZE/BANDAGES/DRESSINGS) ×2 IMPLANT
SUT MNCRL AB 4-0 PS2 18 (SUTURE) ×2 IMPLANT
SUT SILK 2 0 SH (SUTURE) IMPLANT
SUT VIC AB 3-0 SH 27 (SUTURE) ×1
SUT VIC AB 3-0 SH 27X BRD (SUTURE) ×1 IMPLANT
SYR CONTROL 10ML LL (SYRINGE) ×2 IMPLANT
TOWEL OR 17X24 6PK STRL BLUE (TOWEL DISPOSABLE) ×2 IMPLANT
TOWEL OR NON WOVEN STRL DISP B (DISPOSABLE) IMPLANT
TUBE CONNECTING 20X1/4 (TUBING) ×2 IMPLANT
YANKAUER SUCT BULB TIP NO VENT (SUCTIONS) ×2 IMPLANT

## 2013-07-05 NOTE — Transfer of Care (Signed)
Immediate Anesthesia Transfer of Care Note  Patient: Brittney Gibson  Procedure(s) Performed: Procedure(s): LEFT BREAST LUMPECTOMY WITH NEEDLE LOCALIZATION (Left)  Patient Location: PACU  Anesthesia Type:General  Level of Consciousness: awake, alert  and oriented  Airway & Oxygen Therapy: Patient Spontanous Breathing and Patient connected to face mask oxygen  Post-op Assessment: Report given to PACU RN and Post -op Vital signs reviewed and stable  Post vital signs: Reviewed and stable  Complications: No apparent anesthesia complications

## 2013-07-05 NOTE — Anesthesia Preprocedure Evaluation (Addendum)
Anesthesia Evaluation  Patient identified by MRN, date of birth, ID band Patient awake    Reviewed: Allergy & Precautions, H&P , NPO status , Patient's Chart, lab work & pertinent test results  Airway Mallampati: II TM Distance: >3 FB Neck ROM: Full    Dental no notable dental hx. (+) Teeth Intact and Dental Advisory Given   Pulmonary asthma ,  breath sounds clear to auscultation  Pulmonary exam normal       Cardiovascular hypertension, On Medications Rhythm:Regular Rate:Normal     Neuro/Psych negative neurological ROS  negative psych ROS   GI/Hepatic negative GI ROS, Neg liver ROS,   Endo/Other  negative endocrine ROS  Renal/GU negative Renal ROS  negative genitourinary   Musculoskeletal   Abdominal   Peds  Hematology negative hematology ROS (+)   Anesthesia Other Findings   Reproductive/Obstetrics negative OB ROS                          Anesthesia Physical Anesthesia Plan  ASA: II  Anesthesia Plan: General   Post-op Pain Management:    Induction: Intravenous  Airway Management Planned: LMA  Additional Equipment:   Intra-op Plan:   Post-operative Plan: Extubation in OR  Informed Consent: I have reviewed the patients History and Physical, chart, labs and discussed the procedure including the risks, benefits and alternatives for the proposed anesthesia with the patient or authorized representative who has indicated his/her understanding and acceptance.   Dental advisory given  Plan Discussed with: CRNA  Anesthesia Plan Comments:         Anesthesia Quick Evaluation

## 2013-07-05 NOTE — Op Note (Signed)
LEFT BREAST LUMPECTOMY WITH NEEDLE LOCALIZATION  Procedure Note  Brittney Gibson 07/05/2013   Pre-op Diagnosis: left breast papalloma     Post-op Diagnosis: same  Procedure(s): LEFT BREAST LUMPECTOMY WITH NEEDLE LOCALIZATION  Surgeon(s): Harl Bowie, MD  Anesthesia: General  Staff:  Circulator: Timmie Foerster Ward, RN Scrub Person: Josie Dixon, RN  Estimated Blood Loss: Minimal               Specimens: sent to path          [facility]   Date: 07/05/2013  Time: 11:29 AM

## 2013-07-05 NOTE — Anesthesia Procedure Notes (Signed)
Procedure Name: LMA Insertion Date/Time: 07/05/2013 10:55 AM Performed by: Melynda Ripple D Pre-anesthesia Checklist: Patient identified, Emergency Drugs available, Suction available and Patient being monitored Patient Re-evaluated:Patient Re-evaluated prior to inductionOxygen Delivery Method: Circle System Utilized Preoxygenation: Pre-oxygenation with 100% oxygen Intubation Type: IV induction Ventilation: Mask ventilation without difficulty LMA: LMA inserted LMA Size: 4.0 Number of attempts: 1 Airway Equipment and Method: bite block Placement Confirmation: positive ETCO2 Tube secured with: Tape Dental Injury: Teeth and Oropharynx as per pre-operative assessment

## 2013-07-05 NOTE — Anesthesia Postprocedure Evaluation (Signed)
  Anesthesia Post-op Note  Patient: Brittney Gibson  Procedure(s) Performed: Procedure(s): LEFT BREAST LUMPECTOMY WITH NEEDLE LOCALIZATION (Left)  Patient Location: PACU  Anesthesia Type:General  Level of Consciousness: awake and alert   Airway and Oxygen Therapy: Patient Spontanous Breathing  Post-op Pain: mild  Post-op Assessment: Post-op Vital signs reviewed, Patient's Cardiovascular Status Stable and Respiratory Function Stable  Post-op Vital Signs: Reviewed  Filed Vitals:   07/05/13 1230  BP: 114/80  Pulse: 71  Temp:   Resp: 17    Complications: No apparent anesthesia complications

## 2013-07-05 NOTE — Discharge Instructions (Signed)
°Post Anesthesia Home Care Instructions ° °Activity: °Get plenty of rest for the remainder of the day. A responsible adult should stay with you for 24 hours following the procedure.  °For the next 24 hours, DO NOT: °-Drive a car °-Operate machinery °-Drink alcoholic beverages °-Take any medication unless instructed by your physician °-Make any legal decisions or sign important papers. ° °Meals: °Start with liquid foods such as gelatin or soup. Progress to regular foods as tolerated. Avoid greasy, spicy, heavy foods. If nausea and/or vomiting occur, drink only clear liquids until the nausea and/or vomiting subsides. Call your physician if vomiting continues. ° °Special Instructions/Symptoms: °Your throat may feel dry or sore from the anesthesia or the breathing tube placed in your throat during surgery. If this causes discomfort, gargle with warm salt water. The discomfort should disappear within 24 hours. ° ° ° ° ° ° ° °[facility] °Office Phone Number [phone] ° °BREAST BIOPSY/ PARTIAL MASTECTOMY: POST OP INSTRUCTIONS ° °Always review your discharge instruction sheet given to you by the facility where your surgery was performed. ° °IF YOU HAVE DISABILITY OR FAMILY LEAVE FORMS, YOU MUST BRING THEM TO THE OFFICE FOR PROCESSING.  DO NOT GIVE THEM TO YOUR DOCTOR. ° °1. A prescription for pain medication may be given to you upon discharge.  Take your pain medication as prescribed, if needed.  If narcotic pain medicine is not needed, then you may take acetaminophen (Tylenol) or ibuprofen (Advil) as needed. °2. Take your usually prescribed medications unless otherwise directed °3. If you need a refill on your pain medication, please contact your pharmacy.  They will contact our office to request authorization.  Prescriptions will not be filled after 5pm or on week-ends. °4. You should eat very light the first 24 hours after surgery, such as soup, crackers, pudding, etc.  Resume your normal diet the  day after surgery. °5. Most patients will experience some swelling and bruising in the breast.  Ice packs and a good support bra will help.  Swelling and bruising can take several days to resolve.  °6. It is common to experience some constipation if taking pain medication after surgery.  Increasing fluid intake and taking a stool softener will usually help or prevent this problem from occurring.  A mild laxative (Milk of Magnesia or Miralax) should be taken according to package directions if there are no bowel movements after 48 hours. °7. Unless discharge instructions indicate otherwise, you may remove your bandages 24-48 hours after surgery, and you may shower at that time.  You may have steri-strips (small skin tapes) in place directly over the incision.  These strips should be left on the skin for 7-10 days.  If your surgeon used skin glue on the incision, you may shower in 24 hours.  The glue will flake off over the next 2-3 weeks.  Any sutures or staples will be removed at the office during your follow-up visit. °8. ACTIVITIES:  You may resume regular daily activities (gradually increasing) beginning the next day.  Wearing a good support bra or sports bra minimizes pain and swelling.  You may have sexual intercourse when it is comfortable. °a. You may drive when you no longer are taking prescription pain medication, you can comfortably wear a seatbelt, and you can safely maneuver your car and apply brakes. °b. RETURN TO WORK:  ______________________________________________________________________________________ °9. You should see your doctor in the office for a follow-up appointment approximately two weeks after your surgery.  Your doctor’s nurse will   your follow-up appointment when she calls you with your pathology report.  Expect your pathology report 2-3 business days after your surgery.  You may call to check if you do not hear from [place] after three days. 10. OTHER INSTRUCTIONS:  _______________________________________________________________________________________________ _____________________________________________________________________________________________________________________________________ _____________________________________________________________________________________________________________________________________ _____________________________________________________________________________________________________________________________________  WHEN TO CALL YOUR DOCTOR: 1. Fever over 101.0 2. Nausea and/or vomiting. 3. Extreme swelling or bruising. 4. Continued bleeding from incision. 5. Increased pain, redness, or drainage from the incision.  The clinic staff is available to answer your questions during regular business hours.  Please dont hesitate to call and ask to speak to one of the nurses for clinical concerns.  If you have a medical emergency, go to the nearest emergency room or call 911.  A surgeon from [facility] is always on call at the hospital.  For further questions, please visit [email or web address]

## 2013-07-05 NOTE — H&P (Signed)
Patient ID: Brittney Gibson, female DOB: 08-26-1960, 53 y.o. MRN: [id number]  Chief Complaint   Patient presents with   .  Mass     Evaluate breast papilloma   HPI  Brittney Gibson is a 53 y.o. female.  HPI  This is a pleasant female referred by Dr. Loni Muse. Hawkins for evaluation of a recent diagnosis of left breast papilloma. She had sudden bloody nipple discharge from the left breast several months ago. It then recurred prompting an ultrasound and mammogram. She then had a biopsy of a small mass with pathology showing intraductal papilloma. She has no previous history of problems with her breast. Her mother had ductal carcinoma in situ of the breast in her 35s. She is without complaints  Past Medical History   Diagnosis  Date   .  Hypertension    .  Leg pain    .  Asthma     Past Surgical History   Procedure  Laterality  Date   .  Tonsillectomy     .  Hammer toe surgery   2004     bilateral   .  Cataract surgery   2009     right eye   History reviewed. No pertinent family history.  Social History  History   Substance Use Topics   .  Smoking status:  Never Smoker   .  Smokeless tobacco:  Not on file   .  Alcohol Use:  Yes      Comment: social drinker    Allergies   Allergen  Reactions   .  Codeine  Anaphylaxis    Current Outpatient Prescriptions   Medication  Sig  Dispense  Refill   .  bimatoprost (LUMIGAN) 0.01 % SOLN  1 drop at bedtime.     Marland Kitchen  lisinopril-hydrochlorothiazide (PRINZIDE,ZESTORETIC) 20-25 MG per tablet  Take 1 tablet by mouth daily.      No current facility-administered medications for this visit.   Review of Systems  Review of Systems  Constitutional: Negative for fever, chills and unexpected weight change.  HENT: Negative for congestion, hearing loss, sore throat, trouble swallowing and voice change.  Eyes: Negative for visual disturbance.  Respiratory: Negative for cough and wheezing.  Cardiovascular: Negative for chest pain, palpitations and leg swelling.   Gastrointestinal: Negative for nausea, vomiting, abdominal pain, diarrhea, constipation, blood in stool, abdominal distention and anal bleeding.  Genitourinary: Negative for hematuria, vaginal bleeding and difficulty urinating.  Musculoskeletal: Negative for arthralgias.  Skin: Negative for rash and wound.  Neurological: Negative for seizures, syncope and headaches.  Hematological: Negative for adenopathy. Does not bruise/bleed easily.  Psychiatric/Behavioral: Negative for confusion.  Blood pressure 118/74, pulse 71, temperature 97.8 F (36.6 C), temperature source Temporal, resp. rate 16, height 5\' 4"  (1.626 m), weight 195 lb 12.8 oz (88.814 kg).  Physical Exam  Physical Exam  Constitutional: She is oriented to person, place, and time. She appears well-developed and well-nourished. No distress.  HENT:  Head: Normocephalic and atraumatic.  Right Ear: External ear normal.  Left Ear: External ear normal.  Nose: Nose normal.  Mouth/Throat: Oropharynx is clear and moist.  Eyes: Conjunctivae are normal. Pupils are equal, round, and reactive to light. Right eye exhibits no discharge. Left eye exhibits no discharge. No scleral icterus.  Neck: Normal range of motion. Neck supple. No tracheal deviation present. No thyromegaly present.  Cardiovascular: Normal rate, regular rhythm, normal heart sounds and intact distal pulses.  No murmur heard.  Pulmonary/Chest: Effort normal and breath sounds  normal. No respiratory distress. She has no wheezes. She has no rales.  Musculoskeletal: Normal range of motion. She exhibits no edema and no tenderness.  Lymphadenopathy:  She has no cervical adenopathy.  She has no axillary adenopathy.  Neurological: She is alert and oriented to person, place, and time.  Skin: Skin is warm and dry. No rash noted. She is not diaphoretic. No erythema.  Psychiatric: Her behavior is normal. Judgment normal.  Breasts: There are no palpable masses in either breast.  Data  Reviewed  I had a mammogram and ultrasound. This shows a 1.6 cm x 0.4 cm intraductal mass of the left breast. The biopsy shows a sclerosing intraductal papilloma  Assessment  Left breast papilloma  Plan  Needle localized lumpectomy is recommended given the pathology serious have discussed this with her in detail. I discussed the surgical procedure including the risks. These risks include but are not limited to bleeding, infection, need for further surgery should malignancy be present, et Ronney Asters. She understands and wished to proceed. Surgery will be scheduled.

## 2013-07-06 ENCOUNTER — Encounter (HOSPITAL_BASED_OUTPATIENT_CLINIC_OR_DEPARTMENT_OTHER): Payer: Self-pay | Admitting: Surgery

## 2013-07-06 NOTE — Op Note (Signed)
NAMEBRYNNAN, RODENBAUGH NO.:  [id number]  MEDICAL RECORD NO.:  [id number]  LOCATION:                               FACILITY:  [facility]  PHYSICIAN:  Coralie Keens, M.D. DATE OF BIRTH:  [birth year]  DATE OF PROCEDURE:  07/05/2013 DATE OF DISCHARGE:  07/05/2013                              OPERATIVE REPORT   PREOPERATIVE DIAGNOSIS:  Left breast papilloma.  POSTOPERATIVE DIAGNOSIS:  Left breast papilloma.  PROCEDURE:  Needle localized left breast lumpectomy.  SURGEON:  Coralie Keens, M.D.  ANESTHESIA:  General and 0.5% Marcaine.  ESTIMATED BLOOD LOSS:  Minimal.  INDICATIONS:  This is a 53 year old female who was found to have a mass on recent mammography and ultrasound.  Biopsy showed a papilloma and decision was made to proceed with needle localized excision of the papilloma.  PROCEDURE IN DETAIL:  The patient was brought to the operating room and identified as [employer].  She was placed supine on the operating room table and general anesthesia was induced.  She had already had a localization wire placed in left breast at the [facility].  I anesthetized the skin around the localization wire with Marcaine.  I then performed elliptical incision around the wire with a scalpel.  I took this down to the breast tissue with electrocautery.  I then performed a lumpectomy in the outer lower quadrant of the left breast incorporating the localization wire.  I was able to get widely around the wire and specimen.  Once the lumpectomy was completed, I removed the specimen and marked all edges with marker paint.  X-rays then confirmed that the suspicious area marker were in the lumpectomy specimen.  The specimen was sent to Pathology for evaluation.  I anesthetized the wound further with Marcaine.  Hemostasis was achieved with cautery.  I closed the subcutaneous tissue with interrupted 3-0 Vicryl sutures and closed the skin with running 4-0 Monocryl.   Steri-Strips, gauze, and Tegaderm were then applied.  The patient tolerated the procedure well.  All the counts were correct at the end of the procedure.  The patient was then extubated in the operating room and taken in a stable condition to recovery room.     Coralie Keens, M.D.    DB/MEDQ  D:  07/05/2013  T:  07/06/2013  Job:  [id number]

## 2013-07-16 ENCOUNTER — Encounter (INDEPENDENT_AMBULATORY_CARE_PROVIDER_SITE_OTHER): Payer: Self-pay

## 2013-07-27 ENCOUNTER — Encounter (INDEPENDENT_AMBULATORY_CARE_PROVIDER_SITE_OTHER): Payer: Self-pay | Admitting: Surgery

## 2013-07-27 ENCOUNTER — Ambulatory Visit (INDEPENDENT_AMBULATORY_CARE_PROVIDER_SITE_OTHER): Payer: BC Managed Care – PPO | Admitting: Surgery

## 2013-07-27 VITALS — BP 138/90 | HR 72 | Temp 98.2°F | Resp 14 | Ht 64.0 in | Wt 197.2 lb

## 2013-07-27 DIAGNOSIS — Z09 Encounter for follow-up examination after completed treatment for conditions other than malignant neoplasm: Secondary | ICD-10-CM

## 2013-07-27 NOTE — Progress Notes (Signed)
Subjective:     Patient ID: Brittney Gibson, female   DOB: [birth year], 53 y.o.   MRN: [id number]  HPI She is here for first postop visit status post left breast lumpectomy. She's doing well.  Review of Systems     Objective:   Physical Exam On exam, the incision is healing well. The final pathology confirmed an intraductal papilloma with no evidence of atypia or malignancy    Assessment:     Patient stable postop     Plan:     She will continue her self examinations and yearly mammograms. I will see her back as needed

## 2013-07-30 ENCOUNTER — Encounter (INDEPENDENT_AMBULATORY_CARE_PROVIDER_SITE_OTHER): Payer: BC Managed Care – PPO | Admitting: Surgery

## 2014-10-07 ENCOUNTER — Other Ambulatory Visit: Payer: Self-pay | Admitting: Gastroenterology

## 2015-01-20 DIAGNOSIS — E118 Type 2 diabetes mellitus with unspecified complications: Secondary | ICD-10-CM | POA: Insufficient documentation

## 2015-01-20 DIAGNOSIS — I10 Essential (primary) hypertension: Secondary | ICD-10-CM | POA: Insufficient documentation

## 2015-01-20 DIAGNOSIS — E785 Hyperlipidemia, unspecified: Secondary | ICD-10-CM | POA: Insufficient documentation

## 2016-03-10 DIAGNOSIS — B001 Herpesviral vesicular dermatitis: Secondary | ICD-10-CM | POA: Insufficient documentation

## 2016-04-05 DIAGNOSIS — E118 Type 2 diabetes mellitus with unspecified complications: Secondary | ICD-10-CM | POA: Diagnosis not present

## 2016-05-10 DIAGNOSIS — E118 Type 2 diabetes mellitus with unspecified complications: Secondary | ICD-10-CM | POA: Diagnosis not present

## 2016-06-07 DIAGNOSIS — E785 Hyperlipidemia, unspecified: Secondary | ICD-10-CM | POA: Diagnosis not present

## 2016-06-07 DIAGNOSIS — I10 Essential (primary) hypertension: Secondary | ICD-10-CM | POA: Diagnosis not present

## 2016-06-07 DIAGNOSIS — E118 Type 2 diabetes mellitus with unspecified complications: Secondary | ICD-10-CM | POA: Diagnosis not present

## 2016-09-08 DIAGNOSIS — N644 Mastodynia: Secondary | ICD-10-CM | POA: Diagnosis not present

## 2016-09-08 DIAGNOSIS — Z1231 Encounter for screening mammogram for malignant neoplasm of breast: Secondary | ICD-10-CM | POA: Diagnosis not present

## 2016-09-14 DIAGNOSIS — E118 Type 2 diabetes mellitus with unspecified complications: Secondary | ICD-10-CM | POA: Diagnosis not present

## 2016-09-14 DIAGNOSIS — I10 Essential (primary) hypertension: Secondary | ICD-10-CM | POA: Diagnosis not present

## 2016-09-29 DIAGNOSIS — I10 Essential (primary) hypertension: Secondary | ICD-10-CM | POA: Diagnosis not present

## 2016-10-12 DIAGNOSIS — H401131 Primary open-angle glaucoma, bilateral, mild stage: Secondary | ICD-10-CM | POA: Diagnosis not present

## 2016-10-13 DIAGNOSIS — Z1231 Encounter for screening mammogram for malignant neoplasm of breast: Secondary | ICD-10-CM | POA: Diagnosis not present

## 2016-10-19 DIAGNOSIS — I10 Essential (primary) hypertension: Secondary | ICD-10-CM | POA: Diagnosis not present

## 2017-02-03 ENCOUNTER — Ambulatory Visit (HOSPITAL_COMMUNITY)
Admission: EM | Admit: 2017-02-03 | Discharge: 2017-02-03 | Disposition: A | Discharge status: Home / Self Care | Payer: Federal, State, Local not specified - PPO | Attending: Family Medicine | Admitting: Family Medicine

## 2017-02-03 ENCOUNTER — Encounter (HOSPITAL_COMMUNITY): Payer: Self-pay | Admitting: Nurse Practitioner

## 2017-02-03 DIAGNOSIS — M79604 Pain in right leg: Secondary | ICD-10-CM | POA: Diagnosis not present

## 2017-02-03 HISTORY — DX: Type 2 diabetes mellitus without complications: E11.9

## 2017-02-03 HISTORY — DX: Pure hypercholesterolemia, unspecified: E78.00

## 2017-02-03 NOTE — ED Triage Notes (Addendum)
Pt presents with c/o right upper leg pain. The pain is a aching in her right posterior thigh. The pain began about three weeks ago and has been intermittent since. The pain seems worse at night. She denies any injuries, redness, swelling. Shes tried tylenol with some improvement.

## 2017-02-08 NOTE — ED Provider Notes (Signed)
  [place]   [id number] 02/03/17 Arrival Time: 8616  ASSESSMENT & PLAN:  1. Right leg pain   Question bursitis.  Recommend OTC ibuprofen 600-800mg  TID with food for the next 5 days. Normal ROM and activities. Will f/u if not showing improvement.  Reviewed expectations re: course of current medical issues. Questions answered. Outlined signs and symptoms indicating need for more acute intervention. Patient verbalized understanding. After Visit Summary given.   SUBJECTIVE:  Brittney Gibson is a 56 y.o. female who presents with complaint of R leg discomfort for the past three weeks. No injury/trauma. No h/o similar. Ambulatory without difficulty but discomfort worse after prolonged standing and at end of day into the night. Generalized to R upper leg/thigh. Tylenol with some help. Describes as aching. Does have period of time where discomfort resolves. No skin changes or erythema. No rashes. No LE sensation changes or weakness.  ROS: As per HPI.   OBJECTIVE:  Vitals:   02/03/17 1539  BP: (!) 168/93  Pulse: 100  Resp: 16  Temp: 98.3 F (36.8 C)  TempSrc: Oral  SpO2: 98%     General appearance: alert; no distress Abdomen: soft, non-tender; bowel sounds normal; no masses or organomegaly; no guarding or rebound tenderness Back: no CVA tenderness Extremities: no cyanosis or edema; symmetrical with no gross deformities; very slight and generalized tenderness to lateral thigh; no erythema or lesions; is somewhat more tender over trochanteric bursa; LE with bilateral FROM Skin: warm and dry Neurologic: normal symmetric reflexes; normal gait Psychological:  alert and cooperative; normal mood and affect  Past Medical History:  Diagnosis Date  . Asthma   . Contact lens/glasses fitting    contact lt eye  . Diabetes mellitus without complication ([place])   . Hypercholesteremia   . Hypertension   . Leg pain     Allergies  Allergen Reactions  . Codeine Anaphylaxis     PMHx, SurgHx, SocialHx, Medications, and Allergies were reviewed in the Visit Navigator and updated as appropriate.      Vanessa Kick, MD 02/08/17 [phone]

## 2017-04-06 DIAGNOSIS — K080 Exfoliation of teeth due to systemic causes: Secondary | ICD-10-CM | POA: Diagnosis not present

## 2017-04-12 DIAGNOSIS — E119 Type 2 diabetes mellitus without complications: Secondary | ICD-10-CM | POA: Diagnosis not present

## 2017-04-12 DIAGNOSIS — H401131 Primary open-angle glaucoma, bilateral, mild stage: Secondary | ICD-10-CM | POA: Diagnosis not present

## 2017-04-12 DIAGNOSIS — I10 Essential (primary) hypertension: Secondary | ICD-10-CM | POA: Diagnosis not present

## 2017-04-12 DIAGNOSIS — E7800 Pure hypercholesterolemia, unspecified: Secondary | ICD-10-CM | POA: Diagnosis not present

## 2017-04-12 DIAGNOSIS — H5213 Myopia, bilateral: Secondary | ICD-10-CM | POA: Diagnosis not present

## 2017-04-28 DIAGNOSIS — K080 Exfoliation of teeth due to systemic causes: Secondary | ICD-10-CM | POA: Diagnosis not present

## 2017-08-26 DIAGNOSIS — M7541 Impingement syndrome of right shoulder: Secondary | ICD-10-CM | POA: Diagnosis not present

## 2017-08-26 DIAGNOSIS — M25511 Pain in right shoulder: Secondary | ICD-10-CM | POA: Diagnosis not present

## 2017-09-08 ENCOUNTER — Other Ambulatory Visit (HOSPITAL_COMMUNITY)
Admission: RE | Admit: 2017-09-08 | Discharge: 2017-09-08 | Disposition: A | Discharge status: Home / Self Care | Payer: Federal, State, Local not specified - PPO | Source: Ambulatory Visit | Attending: Obstetrics and Gynecology | Admitting: Obstetrics and Gynecology

## 2017-09-08 ENCOUNTER — Other Ambulatory Visit: Payer: Self-pay

## 2017-09-08 ENCOUNTER — Ambulatory Visit: Payer: Federal, State, Local not specified - PPO | Admitting: Obstetrics and Gynecology

## 2017-09-08 ENCOUNTER — Encounter: Payer: Self-pay | Admitting: Obstetrics and Gynecology

## 2017-09-08 VITALS — BP 162/92 | HR 84 | Resp 14 | Ht 63.75 in | Wt 193.0 lb

## 2017-09-08 DIAGNOSIS — K5900 Constipation, unspecified: Secondary | ICD-10-CM | POA: Diagnosis not present

## 2017-09-08 DIAGNOSIS — N952 Postmenopausal atrophic vaginitis: Secondary | ICD-10-CM

## 2017-09-08 DIAGNOSIS — Z803 Family history of malignant neoplasm of breast: Secondary | ICD-10-CM

## 2017-09-08 DIAGNOSIS — I10 Essential (primary) hypertension: Secondary | ICD-10-CM

## 2017-09-08 DIAGNOSIS — Z124 Encounter for screening for malignant neoplasm of cervix: Secondary | ICD-10-CM | POA: Insufficient documentation

## 2017-09-08 DIAGNOSIS — Z01419 Encounter for gynecological examination (general) (routine) without abnormal findings: Secondary | ICD-10-CM

## 2017-09-08 NOTE — Progress Notes (Signed)
57 y.o. [id number] MarriedAfrican AmericanF here for annual exam.   She has HTN, high cholesterol, DM followed by primary. Typically her HgbA1C runs between 5 and 7. Menopausal, no bleeding. Sexually active, uncomfortable unless she uses a lubricant.     No LMP recorded. Patient is postmenopausal.          Sexually active: Yes.    The current method of family planning is post menopausal status.    Exercising: Yes.    walking Smoker:  no  Health Maintenance: Pap:  2017 WNL per patient  History of abnormal Pap:  Yes- 2004 had colposcopy, no surgery.   MMG:  10/13/16 WNL (care everywhere) Colonoscopy:  2015 polyps - repeat in 5 yrs  BMD:   Never TDaP:  02-24-16 Gardasil: N/A   reports that she has never smoked. She has never used smokeless tobacco. She reports that she drinks alcohol. She reports that she does not use drugs. Just occasional ETOH. 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 Retired with her difficult to control BP. Enjoying retirement. 2 kids, one local, other still in [place]. No grand kids. Husband is also retired. Going to [place] in May. They have done a lot of travelling since she retired last May. Son is 92, lives at home, has Schizophrenia. Won't take medication, not working. Causes stress for her.   Past Medical History:  Diagnosis Date  . Asthma   . Contact lens/glasses fitting    contact lt eye  . Diabetes mellitus without complication ([place])   . Genital warts   . Hypercholesteremia   . Hypertension   . Leg pain     Past Surgical History:  Procedure Laterality Date  . BREAST LUMPECTOMY WITH NEEDLE LOCALIZATION Left 07/05/2013   Procedure: LEFT BREAST LUMPECTOMY WITH NEEDLE LOCALIZATION;  Surgeon: Harl Bowie, MD;  Location: [place];  Service: General;  Laterality: Left;  . BREAST SURGERY    . cataract surgery  2009   right eye  . COLPOSCOPY    . HAMMER TOE SURGERY  2004   bilateral  . ORIF WRIST FRACTURE  6/14   gsc  . TONSILLECTOMY      Current Outpatient Medications  Medication Sig Dispense Refill  . amLODipine (NORVASC) 5 MG tablet Take 5 mg by mouth daily.    Marland Kitchen aspirin EC 81 MG tablet Take 81 mg by mouth daily.    Marland Kitchen atorvastatin (LIPITOR) 20 MG tablet Take 20 mg by mouth daily.    . bimatoprost (LUMIGAN) 0.01 % SOLN 1 drop at bedtime.    Marland Kitchen lisinopril-hydrochlorothiazide (PRINZIDE,ZESTORETIC) 20-12.5 MG tablet Take 2 tablets by mouth daily.     . metFORMIN (GLUCOPHAGE) 500 MG tablet Take by mouth 2 (two) times daily with a meal.     No current facility-administered medications for this visit.     Family History  Problem Relation Age of Onset  . Breast cancer Mother   . Diabetes Mother   . Hypertension Mother   . Hypertension Maternal Grandmother   . Glaucoma Maternal Grandmother   Mom was 82-62 when she got breast cancer. Negative BRCA testing.   Review of Systems  Constitutional: Negative.   HENT: Negative.   Eyes: Negative.   Respiratory: Negative.   Cardiovascular: Negative.   Gastrointestinal: Negative.   Endocrine: Negative.   Genitourinary: Negative.   Musculoskeletal: Negative.   Skin: Negative.   Allergic/Immunologic: Negative.   Neurological: Negative.   Psychiatric/Behavioral: Negative.  Has issues with constipation. Has a BM a couple of times a week. She is taking miralax, helping some.  Exam:   BP (!) 162/92 (BP Location: Right Arm, Patient Position: Sitting, Cuff Size: Large)   Pulse 84   Resp 14   Ht 5' 3.75" (1.619 m)   Wt 193 lb (87.5 kg)   BMI 33.39 kg/m   Weight change: @WEIGHTCHANGE@ Height:   Height: 5' 3.75" (161.9 cm)  Ht Readings from Last 3 Encounters:  09/08/17 5' 3.75" (1.619 m)  07/27/13 5' 4" (1.626 m)  07/05/13 5' 4" (1.626 m)    General appearance: alert, cooperative and appears stated age Head: Normocephalic, without obvious abnormality, atraumatic Neck: no adenopathy, supple, symmetrical, trachea midline and thyroid normal to  inspection and palpation Lungs: clear to auscultation bilaterally Cardiovascular: regular rate and rhythm Breasts: normal appearance, no masses or tenderness Abdomen: soft, non-tender; non distended,  no masses,  no organomegaly Extremities: extremities normal, atraumatic, no cyanosis or edema Skin: Skin color, texture, turgor normal. No rashes or lesions Lymph nodes: Cervical, supraclavicular, and axillary nodes normal. No abnormal inguinal nodes palpated Neurologic: Grossly normal   Pelvic: External genitalia:  no lesions              Urethra:  normal appearing urethra with no masses, tenderness or lesions              Bartholins and Skenes: normal                 Vagina: atrophic appearing vagina with normal color and discharge, no lesions              Cervix: no lesions               Bimanual Exam:  Uterus:  normal size, contour, position, consistency, mobility, non-tender              Adnexa: no mass, fullness, tenderness               Rectovaginal: Confirms               Anus:  normal sphincter tone, no lesions  Chaperone was present for exam.  A:  Well Woman with normal exam  Constipation  Vaginal atrophy  HTN, elevated BP today. If still high on recheck will have her f/u with her primary  Reports well controlled DM  On medication for elevated lipids   P:   Pap with hpv  Recommended BSE, 3D mammogram  Discussed treatment of constipation  Continue to use lubrication with intercourse, if not helping, consider vaginal estrogen  Colonoscopy next year  Labs with primary  Addendum: The patient left prior to having a repeat BP. Will have her check it at home, if still elevated she needs to f/u with her.      

## 2017-09-08 NOTE — Patient Instructions (Signed)
EXERCISE AND DIET:  We recommended that you start or continue a regular exercise program for good health. Regular exercise means any activity that makes your heart beat faster and makes you sweat.  We recommend exercising at least 30 minutes per day at least 3 days a week, preferably 4 or 5.  We also recommend a diet low in fat and sugar.  Inactivity, poor dietary choices and obesity can cause diabetes, heart attack, stroke, and kidney damage, among others.    ALCOHOL AND SMOKING:  Women should limit their alcohol intake to no more than 7 drinks/beers/glasses of wine (combined, not each!) per week. Moderation of alcohol intake to this level decreases your risk of breast cancer and liver damage. And of course, no recreational drugs are part of a healthy lifestyle.  And absolutely no smoking or even second hand smoke. Most people know smoking can cause heart and lung diseases, but did you know it also contributes to weakening of your bones? Aging of your skin?  Yellowing of your teeth and nails?  CALCIUM AND VITAMIN D:  Adequate intake of calcium and Vitamin D are recommended.  The recommendations for exact amounts of these supplements seem to change often, but generally speaking 600 mg of calcium (either carbonate or citrate) and 800 units of Vitamin D per day seems prudent. Certain women may benefit from higher intake of Vitamin D.  If you are among these women, your doctor will have told you during your visit.    PAP SMEARS:  Pap smears, to check for cervical cancer or precancers,  have traditionally been done yearly, although recent scientific advances have shown that most women can have pap smears less often.  However, every woman still should have a physical exam from her gynecologist every year. It will include a breast check, inspection of the vulva and vagina to check for abnormal growths or skin changes, a visual exam of the cervix, and then an exam to evaluate the size and shape of the uterus and  ovaries.  And after 59 years of age, a rectal exam is indicated to check for rectal cancers. We will also provide age appropriate advice regarding health maintenance, like when you should have certain vaccines, screening for sexually transmitted diseases, bone density testing, colonoscopy, mammograms, etc.   MAMMOGRAMS:  All women over 40 years old should have a yearly mammogram. Many facilities now offer a "3D" mammogram, which may cost around $50 extra out of pocket. If possible,  we recommend you accept the option to have the 3D mammogram performed.  It both reduces the number of women who will be called back for extra views which then turn out to be normal, and it is better than the routine mammogram at detecting truly abnormal areas.    COLONOSCOPY:  Colonoscopy to screen for colon cancer is recommended for all women at age 50.  We know, you hate the idea of the prep.  We agree, BUT, having colon cancer and not knowing it is worse!!  Colon cancer so often starts as a polyp that can be seen and removed at colonscopy, which can quite literally save your life!  And if your first colonoscopy is normal and you have no family history of colon cancer, most women don't have to have it again for 10 years.  Once every ten years, you can do something that may end up saving your life, right?  We will be happy to help you get it scheduled when you are ready.    Be sure to check your insurance coverage so you understand how much it will cost.  It may be covered as a preventative service at no cost, but you should check your particular policy.     About Constipation  Constipation Overview Constipation is the most common gastrointestinal complaint - about 4 million Americans experience constipation and make 2.5 million physician visits a year to get help for the problem.  Constipation can occur when the colon absorbs too much water, the colon's muscle contraction is slow or sluggish, and/or there is delayed transit  time through the colon.  The result is stool that is hard and dry.  Indicators of constipation include straining during bowel movements greater than 25% of the time, having fewer than three bowel movements per week, and/or the feeling of incomplete evacuation.  There are established guidelines ([place] II ) for defining constipation. A person needs to have two or more of the following symptoms for at least 12 weeks (not necessarily consecutive) in the preceding 12 months: . Straining in  greater than 25% of bowel movements . Lumpy or hard stools in greater than 25% of bowel movements . Sensation of incomplete emptying in greater than 25% of bowel movements . Sensation of anorectal obstruction/blockade in greater than 25% of bowel movements . Manual maneuvers to help empty greater than 25% of bowel movements (e.g., digital evacuation, support of the pelvic floor)  . Less than  3 bowel movements/week . Loose stools are not present, and criteria for irritable bowel syndrome are insufficient  Common Causes of Constipation . Lack of fiber in your diet . Lack of physical activity . Medications, including iron and calcium supplements  . Dairy intake . Dehydration . Abuse of laxatives  Travel  Irritable Bowel Syndrome  Pregnancy  Luteal phase of menstruation (after ovulation and before menses)  Colorectal problems  Intestinal Dysfunction  Treating Constipation  There are several ways of treating constipation, including changes to diet and exercise, use of laxatives, adjustments to the pelvic floor, and scheduled toileting.  These treatments include: . increasing fiber and fluids in the diet  . increasing physical activity . learning muscle coordination   learning proper toileting techniques and toileting modifications   designing and sticking  to a toileting schedule     2007, [employer] Doc.22 Breast Self-Awareness Breast self-awareness means being familiar with how  your breasts look and feel. It involves checking your breasts regularly and reporting any changes to your health care provider. Practicing breast self-awareness is important. A change in your breasts can be a sign of a serious medical problem. Being familiar with how your breasts look and feel allows you to find any problems early, when treatment is more likely to be successful. All women should practice breast self-awareness, including women who have had breast implants. How to do a breast self-exam One way to learn what is normal for your breasts and whether your breasts are changing is to do a breast self-exam. To do a breast self-exam: Look for Changes  1. Remove all the clothing above your waist. 2. Stand in front of a mirror in a room with good lighting. 3. Put your hands on your hips. 4. Push your hands firmly downward. 5. Compare your breasts in the mirror. Look for differences between them (asymmetry), such as: ? Differences in shape. ? Differences in size. ? Puckers, dips, and bumps in one breast and not the other. 6. Look at each breast for changes in your skin, such  as: ? Redness. ? Scaly areas. 7. Look for changes in your nipples, such as: ? Discharge. ? Bleeding. ? Dimpling. ? Redness. ? A change in position. Feel for Changes  Carefully feel your breasts for lumps and changes. It is best to do this while lying on your back on the floor and again while sitting or standing in the shower or tub with soapy water on your skin. Feel each breast in the following way:  Place the arm on the side of the breast you are examining above your head.  Feel your breast with the other hand.  Start in the nipple area and make  inch (2 cm) overlapping circles to feel your breast. Use the pads of your three middle fingers to do this. Apply light pressure, then medium pressure, then firm pressure. The light pressure will allow you to feel the tissue closest to the skin. The medium pressure  will allow you to feel the tissue that is a little deeper. The firm pressure will allow you to feel the tissue close to the ribs.  Continue the overlapping circles, moving downward over the breast until you feel your ribs below your breast.  Move one finger-width toward the center of the body. Continue to use the  inch (2 cm) overlapping circles to feel your breast as you move slowly up toward your collarbone.  Continue the up and down exam using all three pressures until you reach your armpit.  Write Down What You Find  Write down what is normal for each breast and any changes that you find. Keep a written record with breast changes or normal findings for each breast. By writing this information down, you do not need to depend only on memory for size, tenderness, or location. Write down where you are in your menstrual cycle, if you are still menstruating. If you are having trouble noticing differences in your breasts, do not get discouraged. With time you will become more familiar with the variations in your breasts and more comfortable with the exam. How often should I examine my breasts? Examine your breasts every month. If you are breastfeeding, the best time to examine your breasts is after a feeding or after using a breast pump. If you menstruate, the best time to examine your breasts is 5-7 days after your period is over. During your period, your breasts are lumpier, and it may be more difficult to notice changes. When should I see my health care provider? See your health care provider if you notice:  A change in shape or size of your breasts or nipples.  A change in the skin of your breast or nipples, such as a reddened or scaly area.  Unusual discharge from your nipples.  A lump or thick area that was not there before.  Pain in your breasts.  Anything that concerns you.  This information is not intended to replace advice given to you by your health care provider. Make sure you  discuss any questions you have with your health care provider. Document Released: 06/07/2005 Document Revised: 11/13/2015 Document Reviewed: 04/27/2015 Elsevier Interactive Patient Education  [employer].

## 2017-09-12 LAB — CYTOLOGY - PAP
Diagnosis: NEGATIVE
HPV: NOT DETECTED

## 2017-09-28 DIAGNOSIS — M25511 Pain in right shoulder: Secondary | ICD-10-CM | POA: Diagnosis not present

## 2017-09-28 DIAGNOSIS — M7541 Impingement syndrome of right shoulder: Secondary | ICD-10-CM | POA: Diagnosis not present

## 2017-10-04 ENCOUNTER — Telehealth: Payer: Self-pay | Admitting: Obstetrics and Gynecology

## 2017-10-04 DIAGNOSIS — H401131 Primary open-angle glaucoma, bilateral, mild stage: Secondary | ICD-10-CM | POA: Diagnosis not present

## 2017-10-04 NOTE — Telephone Encounter (Signed)
Left detailed message, name identified on voicemail, ok per dpr. Advised 09/08/17 pap normal, hpv not detected. Return call to office with any additional questions.    Notes recorded by Salvadore Dom, MD on 09/13/2017 at 9:40 AM EDT 02 recall  Routing to provider for final review. Patient is agreeable to disposition. Will close encounter.

## 2017-10-04 NOTE — Telephone Encounter (Signed)
Patient calling for pap results

## 2017-10-05 DIAGNOSIS — Z1231 Encounter for screening mammogram for malignant neoplasm of breast: Secondary | ICD-10-CM | POA: Diagnosis not present

## 2017-10-11 DIAGNOSIS — K080 Exfoliation of teeth due to systemic causes: Secondary | ICD-10-CM | POA: Diagnosis not present

## 2017-10-24 ENCOUNTER — Encounter: Payer: Self-pay | Admitting: Obstetrics and Gynecology

## 2017-12-14 DIAGNOSIS — K5900 Constipation, unspecified: Secondary | ICD-10-CM | POA: Diagnosis not present

## 2017-12-20 DIAGNOSIS — Z0000 Encounter for general adult medical examination without abnormal findings: Secondary | ICD-10-CM | POA: Diagnosis not present

## 2017-12-20 DIAGNOSIS — E7800 Pure hypercholesterolemia, unspecified: Secondary | ICD-10-CM | POA: Diagnosis not present

## 2017-12-20 DIAGNOSIS — Z23 Encounter for immunization: Secondary | ICD-10-CM | POA: Diagnosis not present

## 2017-12-20 DIAGNOSIS — Z1159 Encounter for screening for other viral diseases: Secondary | ICD-10-CM | POA: Diagnosis not present

## 2017-12-20 DIAGNOSIS — E119 Type 2 diabetes mellitus without complications: Secondary | ICD-10-CM | POA: Diagnosis not present

## 2018-03-13 DIAGNOSIS — R51 Headache: Secondary | ICD-10-CM | POA: Diagnosis not present

## 2018-03-13 DIAGNOSIS — I10 Essential (primary) hypertension: Secondary | ICD-10-CM | POA: Diagnosis not present

## 2018-04-28 ENCOUNTER — Other Ambulatory Visit: Payer: Self-pay | Admitting: Family Medicine

## 2018-04-28 DIAGNOSIS — G44201 Tension-type headache, unspecified, intractable: Secondary | ICD-10-CM

## 2018-04-28 DIAGNOSIS — I10 Essential (primary) hypertension: Secondary | ICD-10-CM | POA: Diagnosis not present

## 2018-04-28 DIAGNOSIS — R51 Headache: Secondary | ICD-10-CM | POA: Diagnosis not present

## 2018-05-09 ENCOUNTER — Ambulatory Visit
Admission: RE | Admit: 2018-05-09 | Discharge: 2018-05-09 | Disposition: A | Discharge status: Home / Self Care | Source: Ambulatory Visit | Attending: Family Medicine | Admitting: Family Medicine

## 2018-05-09 DIAGNOSIS — R51 Headache: Secondary | ICD-10-CM | POA: Diagnosis not present

## 2018-05-09 DIAGNOSIS — G44201 Tension-type headache, unspecified, intractable: Secondary | ICD-10-CM

## 2018-05-10 DIAGNOSIS — H401131 Primary open-angle glaucoma, bilateral, mild stage: Secondary | ICD-10-CM | POA: Diagnosis not present

## 2018-05-10 DIAGNOSIS — H2512 Age-related nuclear cataract, left eye: Secondary | ICD-10-CM | POA: Diagnosis not present

## 2018-05-10 DIAGNOSIS — E119 Type 2 diabetes mellitus without complications: Secondary | ICD-10-CM | POA: Diagnosis not present

## 2018-05-10 DIAGNOSIS — H524 Presbyopia: Secondary | ICD-10-CM | POA: Diagnosis not present

## 2018-07-05 DIAGNOSIS — I10 Essential (primary) hypertension: Secondary | ICD-10-CM | POA: Diagnosis not present

## 2018-07-05 DIAGNOSIS — E119 Type 2 diabetes mellitus without complications: Secondary | ICD-10-CM | POA: Diagnosis not present

## 2018-07-05 DIAGNOSIS — E7800 Pure hypercholesterolemia, unspecified: Secondary | ICD-10-CM | POA: Diagnosis not present

## 2018-07-05 DIAGNOSIS — E669 Obesity, unspecified: Secondary | ICD-10-CM | POA: Diagnosis not present

## 2018-10-12 ENCOUNTER — Ambulatory Visit: Payer: Federal, State, Local not specified - PPO | Admitting: Obstetrics and Gynecology

## 2019-01-11 DIAGNOSIS — Z0000 Encounter for general adult medical examination without abnormal findings: Secondary | ICD-10-CM | POA: Diagnosis not present

## 2019-01-23 ENCOUNTER — Other Ambulatory Visit: Payer: Self-pay

## 2019-01-23 DIAGNOSIS — E119 Type 2 diabetes mellitus without complications: Secondary | ICD-10-CM | POA: Diagnosis not present

## 2019-01-23 DIAGNOSIS — Z5181 Encounter for therapeutic drug level monitoring: Secondary | ICD-10-CM | POA: Diagnosis not present

## 2019-01-23 DIAGNOSIS — E7800 Pure hypercholesterolemia, unspecified: Secondary | ICD-10-CM | POA: Diagnosis not present

## 2019-01-24 DIAGNOSIS — H401131 Primary open-angle glaucoma, bilateral, mild stage: Secondary | ICD-10-CM | POA: Diagnosis not present

## 2019-01-24 NOTE — Progress Notes (Signed)
58 y.o. G71P2002 Married Black or [place] American Not Hispanic or Latino female here for annual exam.  No vaginal bleeding. Sexually active, no pain as long as she uses lubrication.  Intermittent constipation.   H/O HTN, elevated lipids and DM, followed by primary MD. Last HgbA1C ~5.8.    No LMP recorded. Patient is postmenopausal.          Sexually active: Yes.    The current method of family planning is post menopausal status.    Exercising: Yes.    walking 4 times a week Smoker:  no   Health Maintenance: Pap:  09/08/2017 WNL NEG HPV History of abnormal Pap:  Yes- 2004 had colposcopy, no surgery.   MMG:  10/05/2017 Birads 1 negative, appointment tomorrow. Colonoscopy:  2015 polyps - repeat in 5 yrs  BMD:   Never TDaP:  02-24-16 Gardasil: N/A   reports that she has never smoked. She has never used smokeless tobacco. She reports current alcohol use. She reports that she does not use drugs. Just occasional ETOH. 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 2 kids, son lives at home, has Schizophrenia. Daughter is buying a house locally, she is a physical therapist, no children.   Past Medical History:  Diagnosis Date  . Asthma   . Contact lens/glasses fitting    contact lt eye  . Diabetes mellitus without complication ([place])   . Genital warts   . Hypercholesteremia   . Hypertension   . Leg pain   She has high eye pressure, not technically diagnosed with glaucoma.   Past Surgical History:  Procedure Laterality Date  . BREAST LUMPECTOMY WITH NEEDLE LOCALIZATION Left 07/05/2013   Procedure: LEFT BREAST LUMPECTOMY WITH NEEDLE LOCALIZATION;  Surgeon: Harl Bowie, MD;  Location: [facility];  Service: General;  Laterality: Left;  . BREAST SURGERY    . cataract surgery  2009   right eye  . COLPOSCOPY    . HAMMER TOE SURGERY  2004   bilateral  . ORIF WRIST FRACTURE  6/14   gsc  . TONSILLECTOMY      Current Outpatient Medications   Medication Sig Dispense Refill  . amLODipine (NORVASC) 5 MG tablet Take 5 mg by mouth daily.    Marland Kitchen aspirin EC 81 MG tablet Take 81 mg by mouth daily.    Marland Kitchen atorvastatin (LIPITOR) 20 MG tablet Take 20 mg by mouth daily.    . dorzolamide-timolol (COSOPT) 22.3-6.8 MG/ML ophthalmic solution Place 1 drop into both eyes daily.    Marland Kitchen lisinopril-hydrochlorothiazide (PRINZIDE,ZESTORETIC) 20-12.5 MG tablet Take 2 tablets by mouth daily.     . metFORMIN (GLUCOPHAGE) 500 MG tablet Take by mouth 2 (two) times daily with a meal.     No current facility-administered medications for this visit.     Family History  Problem Relation Age of Onset  . Breast cancer Mother   . Diabetes Mother   . Hypertension Mother   . Hypertension Maternal Grandmother   . Glaucoma Maternal Grandmother   Mom was ~61 with h/o breast cancer.   Review of Systems  Constitutional: Negative.   HENT: Negative.   Eyes: Negative.   Respiratory: Negative.   Cardiovascular: Negative.   Gastrointestinal: Negative.   Endocrine: Negative.   Genitourinary: Negative.   Musculoskeletal: Negative.   Skin: Negative.   Allergic/Immunologic: Negative.   Neurological: Negative.   Hematological: Negative.   Psychiatric/Behavioral: Negative.     Exam:   BP (!) 150/90 (  BP Location: Left Arm, Patient Position: Sitting, Cuff Size: Large)   Pulse 64   Temp 97.8 F (36.6 C) (Skin)   Ht 5\' 4"  (1.626 m)   Wt 183 lb (83 kg)   BMI 31.41 kg/m   Weight change: @WEIGHTCHANGE @ Height:   Height: 5\' 4"  (162.6 cm)  Ht Readings from Last 3 Encounters:  01/25/19 5\' 4"  (1.626 m)  09/08/17 5' 3.75" (1.619 m)  07/27/13 5\' 4"  (1.626 m)    General appearance: alert, cooperative and appears stated age Head: Normocephalic, without obvious abnormality, atraumatic Neck: no adenopathy, supple, symmetrical, trachea midline and thyroid normal to inspection and palpation Lungs: clear to auscultation bilaterally Cardiovascular: regular rate and  rhythm Breasts: normal appearance, no masses or tenderness Abdomen: soft, non-tender; non distended,  no masses,  no organomegaly Extremities: extremities normal, atraumatic, no cyanosis or edema Skin: Skin color, texture, turgor normal. No rashes or lesions Lymph nodes: Cervical, supraclavicular, and axillary nodes normal. No abnormal inguinal nodes palpated Neurologic: Grossly normal   Pelvic: External genitalia:  no lesions              Urethra:  normal appearing urethra with no masses, tenderness or lesions              Bartholins and Skenes: normal                 Vagina: normal appearing vagina with normal color and discharge, no lesions              Cervix: no lesions               Bimanual Exam:  Uterus:  normal size, contour, position, consistency, mobility, non-tender              Adnexa: no mass, fullness, tenderness               Rectovaginal: Confirms               Anus:  normal sphincter tone, no lesions  Chaperone was present for exam.  A:  Well Woman with normal exam  P:   No pap needed  Mammogram tomorrow, 3D  Colonoscopy due (will confirm with GI)  Discussed breast self exam  Discussed calcium and vit D intake  Labs with primary

## 2019-01-25 ENCOUNTER — Ambulatory Visit (INDEPENDENT_AMBULATORY_CARE_PROVIDER_SITE_OTHER): Payer: Federal, State, Local not specified - PPO | Admitting: Obstetrics and Gynecology

## 2019-01-25 ENCOUNTER — Encounter: Payer: Self-pay | Admitting: Obstetrics and Gynecology

## 2019-01-25 ENCOUNTER — Other Ambulatory Visit: Payer: Self-pay

## 2019-01-25 VITALS — BP 150/90 | HR 64 | Temp 97.8°F | Ht 64.0 in | Wt 183.0 lb

## 2019-01-25 DIAGNOSIS — H40059 Ocular hypertension, unspecified eye: Secondary | ICD-10-CM | POA: Insufficient documentation

## 2019-01-25 DIAGNOSIS — K5900 Constipation, unspecified: Secondary | ICD-10-CM | POA: Diagnosis not present

## 2019-01-25 DIAGNOSIS — Z803 Family history of malignant neoplasm of breast: Secondary | ICD-10-CM | POA: Diagnosis not present

## 2019-01-25 DIAGNOSIS — Z01419 Encounter for gynecological examination (general) (routine) without abnormal findings: Secondary | ICD-10-CM

## 2019-01-25 DIAGNOSIS — N952 Postmenopausal atrophic vaginitis: Secondary | ICD-10-CM | POA: Diagnosis not present

## 2019-01-25 NOTE — Patient Instructions (Addendum)
EXERCISE AND DIET:  We recommended that you start or continue a regular exercise program for good health. Regular exercise means any activity that makes your heart beat faster and makes you sweat.  We recommend exercising at least 30 minutes per day at least 3 days a week, preferably 4 or 5.  We also recommend a diet low in fat and sugar.  Inactivity, poor dietary choices and obesity can cause diabetes, heart attack, stroke, and kidney damage, among others.    ALCOHOL AND SMOKING:  Women should limit their alcohol intake to no more than 7 drinks/beers/glasses of wine (combined, not each!) per week. Moderation of alcohol intake to this level decreases your risk of breast cancer and liver damage. And of course, no recreational drugs are part of a healthy lifestyle.  And absolutely no smoking or even second hand smoke. Most people know smoking can cause heart and lung diseases, but did you know it also contributes to weakening of your bones? Aging of your skin?  Yellowing of your teeth and nails?  CALCIUM AND VITAMIN D:  Adequate intake of calcium and Vitamin D are recommended.  The recommendations for exact amounts of these supplements seem to change often, but generally speaking 1,200 mg of calcium (between diet and supplement) and 800 units of Vitamin D per day seems prudent. Certain women may benefit from higher intake of Vitamin D.  If you are among these women, your doctor will have told you during your visit.    PAP SMEARS:  Pap smears, to check for cervical cancer or precancers,  have traditionally been done yearly, although recent scientific advances have shown that most women can have pap smears less often.  However, every woman still should have a physical exam from her gynecologist every year. It will include a breast check, inspection of the vulva and vagina to check for abnormal growths or skin changes, a visual exam of the cervix, and then an exam to evaluate the size and shape of the uterus and  ovaries.  And after 60 years of age, a rectal exam is indicated to check for rectal cancers. We will also provide age appropriate advice regarding health maintenance, like when you should have certain vaccines, screening for sexually transmitted diseases, bone density testing, colonoscopy, mammograms, etc.   MAMMOGRAMS:  All women over 40 years old should have a yearly mammogram. Many facilities now offer a "3D" mammogram, which may cost around $50 extra out of pocket. If possible,  we recommend you accept the option to have the 3D mammogram performed.  It both reduces the number of women who will be called back for extra views which then turn out to be normal, and it is better than the routine mammogram at detecting truly abnormal areas.    COLON CANCER SCREENING: Now recommend starting at age 45. At this time colonoscopy is not covered for routine screening until 50. There are take home tests that can be done between 45-49.   COLONOSCOPY:  Colonoscopy to screen for colon cancer is recommended for all women at age 50.  We know, you hate the idea of the prep.  We agree, BUT, having colon cancer and not knowing it is worse!!  Colon cancer so often starts as a polyp that can be seen and removed at colonscopy, which can quite literally save your life!  And if your first colonoscopy is normal and you have no family history of colon cancer, most women don't have to have it again for   10 years.  Once every ten years, you can do something that may end up saving your life, right?  We will be happy to help you get it scheduled when you are ready.  Be sure to check your insurance coverage so you understand how much it will cost.  It may be covered as a preventative service at no cost, but you should check your particular policy.      Breast Self-Awareness Breast self-awareness means being familiar with how your breasts look and feel. It involves checking your breasts regularly and reporting any changes to your  health care provider. Practicing breast self-awareness is important. A change in your breasts can be a sign of a serious medical problem. Being familiar with how your breasts look and feel allows you to find any problems early, when treatment is more likely to be successful. All women should practice breast self-awareness, including women who have had breast implants. How to do a breast self-exam One way to learn what is normal for your breasts and whether your breasts are changing is to do a breast self-exam. To do a breast self-exam: Look for Changes  1. Remove all the clothing above your waist. 2. Stand in front of a mirror in a room with good lighting. 3. Put your hands on your hips. 4. Push your hands firmly downward. 5. Compare your breasts in the mirror. Look for differences between them (asymmetry), such as: ? Differences in shape. ? Differences in size. ? Puckers, dips, and bumps in one breast and not the other. 6. Look at each breast for changes in your skin, such as: ? Redness. ? Scaly areas. 7. Look for changes in your nipples, such as: ? Discharge. ? Bleeding. ? Dimpling. ? Redness. ? A change in position. Feel for Changes Carefully feel your breasts for lumps and changes. It is best to do this while lying on your back on the floor and again while sitting or standing in the shower or tub with soapy water on your skin. Feel each breast in the following way:  Place the arm on the side of the breast you are examining above your head.  Feel your breast with the other hand.  Start in the nipple area and make  inch (2 cm) overlapping circles to feel your breast. Use the pads of your three middle fingers to do this. Apply light pressure, then medium pressure, then firm pressure. The light pressure will allow you to feel the tissue closest to the skin. The medium pressure will allow you to feel the tissue that is a little deeper. The firm pressure will allow you to feel the tissue  close to the ribs.  Continue the overlapping circles, moving downward over the breast until you feel your ribs below your breast.  Move one finger-width toward the center of the body. Continue to use the  inch (2 cm) overlapping circles to feel your breast as you move slowly up toward your collarbone.  Continue the up and down exam using all three pressures until you reach your armpit.  Write Down What You Find  Write down what is normal for each breast and any changes that you find. Keep a written record with breast changes or normal findings for each breast. By writing this information down, you do not need to depend only on memory for size, tenderness, or location. Write down where you are in your menstrual cycle, if you are still menstruating. If you are having trouble noticing differences   in your breasts, do not get discouraged. With time you will become more familiar with the variations in your breasts and more comfortable with the exam. How often should I examine my breasts? Examine your breasts every month. If you are breastfeeding, the best time to examine your breasts is after a feeding or after using a breast pump. If you menstruate, the best time to examine your breasts is 5-7 days after your period is over. During your period, your breasts are lumpier, and it may be more difficult to notice changes. When should I see my health care provider? See your health care provider if you notice:  A change in shape or size of your breasts or nipples.  A change in the skin of your breast or nipples, such as a reddened or scaly area.  Unusual discharge from your nipples.  A lump or thick area that was not there before.  Pain in your breasts.  Anything that concerns you.  About Constipation  Constipation Overview Constipation is the most common gastrointestinal complaint - about 4 million Americans experience constipation and make 2.5 million physician visits a year to get help for  the problem.  Constipation can occur when the colon absorbs too much water, the colon's muscle contraction is slow or sluggish, and/or there is delayed transit time through the colon.  The result is stool that is hard and dry.  Indicators of constipation include straining during bowel movements greater than 25% of the time, having fewer than three bowel movements per week, and/or the feeling of incomplete evacuation.  There are established guidelines ([place] II ) for defining constipation. A person needs to have two or more of the following symptoms for at least 12 weeks (not necessarily consecutive) in the preceding 12 months: . Straining in  greater than 25% of bowel movements . Lumpy or hard stools in greater than 25% of bowel movements . Sensation of incomplete emptying in greater than 25% of bowel movements . Sensation of anorectal obstruction/blockade in greater than 25% of bowel movements . Manual maneuvers to help empty greater than 25% of bowel movements (e.g., digital evacuation, support of the pelvic floor)  . Less than  3 bowel movements/week . Loose stools are not present, and criteria for irritable bowel syndrome are insufficient  Common Causes of Constipation . Lack of fiber in your diet . Lack of physical activity . Medications, including iron and calcium supplements  . Dairy intake . Dehydration . Abuse of laxatives  Travel  Irritable Bowel Syndrome  Pregnancy  Luteal phase of menstruation (after ovulation and before menses)  Colorectal problems  Intestinal Dysfunction  Treating Constipation  There are several ways of treating constipation, including changes to diet and exercise, use of laxatives, adjustments to the pelvic floor, and scheduled toileting.  These treatments include: . increasing fiber and fluids in the diet  . increasing physical activity . learning muscle coordination   learning proper toileting techniques and toileting modifications   designing  and sticking  to a toileting schedule     2007, [employer] Doc.22

## 2019-01-26 ENCOUNTER — Encounter: Payer: Self-pay | Admitting: Obstetrics and Gynecology

## 2019-01-26 DIAGNOSIS — Z1231 Encounter for screening mammogram for malignant neoplasm of breast: Secondary | ICD-10-CM | POA: Diagnosis not present

## 2019-01-26 DIAGNOSIS — Z803 Family history of malignant neoplasm of breast: Secondary | ICD-10-CM | POA: Diagnosis not present

## 2019-03-07 DIAGNOSIS — H401131 Primary open-angle glaucoma, bilateral, mild stage: Secondary | ICD-10-CM | POA: Diagnosis not present

## 2019-06-21 ENCOUNTER — Encounter

## 2019-07-16 DIAGNOSIS — B001 Herpesviral vesicular dermatitis: Secondary | ICD-10-CM | POA: Diagnosis not present

## 2019-07-16 DIAGNOSIS — E7800 Pure hypercholesterolemia, unspecified: Secondary | ICD-10-CM | POA: Diagnosis not present

## 2019-07-16 DIAGNOSIS — E119 Type 2 diabetes mellitus without complications: Secondary | ICD-10-CM | POA: Diagnosis not present

## 2019-07-16 DIAGNOSIS — I10 Essential (primary) hypertension: Secondary | ICD-10-CM | POA: Diagnosis not present

## 2019-08-14 DIAGNOSIS — E119 Type 2 diabetes mellitus without complications: Secondary | ICD-10-CM | POA: Diagnosis not present

## 2019-09-17 DIAGNOSIS — H401131 Primary open-angle glaucoma, bilateral, mild stage: Secondary | ICD-10-CM | POA: Diagnosis not present

## 2019-09-17 DIAGNOSIS — E119 Type 2 diabetes mellitus without complications: Secondary | ICD-10-CM | POA: Diagnosis not present

## 2019-09-17 DIAGNOSIS — H2512 Age-related nuclear cataract, left eye: Secondary | ICD-10-CM | POA: Diagnosis not present

## 2019-09-17 DIAGNOSIS — H5212 Myopia, left eye: Secondary | ICD-10-CM | POA: Diagnosis not present

## 2020-01-14 DIAGNOSIS — E669 Obesity, unspecified: Secondary | ICD-10-CM | POA: Diagnosis not present

## 2020-01-14 DIAGNOSIS — E7800 Pure hypercholesterolemia, unspecified: Secondary | ICD-10-CM | POA: Diagnosis not present

## 2020-01-14 DIAGNOSIS — E119 Type 2 diabetes mellitus without complications: Secondary | ICD-10-CM | POA: Diagnosis not present

## 2020-01-14 DIAGNOSIS — I10 Essential (primary) hypertension: Secondary | ICD-10-CM | POA: Diagnosis not present

## 2020-01-28 DIAGNOSIS — Z1231 Encounter for screening mammogram for malignant neoplasm of breast: Secondary | ICD-10-CM | POA: Diagnosis not present

## 2020-01-30 NOTE — Progress Notes (Signed)
Marland Kitchen 59 y.o. G53P2002 Married Black or [place] American Not Hispanic or Latino female here for annual exam.  No vaginal bleeding.  Baseline constipation, helped with miralax. She has some urge incontinence with a really full bladder. Symptoms have been present for a couple of year. Not a problem.  Sexually active, no pain.     Last HgbA1C was 6.5%  No LMP recorded. Patient is postmenopausal.          Sexually active: Yes.    The current method of family planning is post menopausal status.    Exercising: No.  The patient does not participate in regular exercise at present. Smoker:  no  Health Maintenance: Pap:  09-08-17 neg HPV HR neg History of abnormal Pap:  Yes 2004 had colpo MMG:  01-26-2019 category a density birads 1:neg, Just done on Monday BMD:   none Colonoscopy: 2015 polyps f/u 61yrs, next month TDaP:  2017 Gardasil: no   reports that she has never smoked. She has never used smokeless tobacco. She reports current alcohol use. She reports that she does not use drugs. Just occasional ETOH. 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 2 kids, son lives at home, has Schizophrenia. 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   Past Medical History:  Diagnosis Date  . Asthma   . Contact lens/glasses fitting    contact lt eye  . Diabetes mellitus without complication ([place])   . Genital warts   . Hypercholesteremia   . Hypertension   . Increased pressure in the eye    not diagnosed with glaucoma yet  . Leg pain     Past Surgical History:  Procedure Laterality Date  . BREAST LUMPECTOMY WITH NEEDLE LOCALIZATION Left 07/05/2013   Procedure: LEFT BREAST LUMPECTOMY WITH NEEDLE LOCALIZATION;  Surgeon: Harl Bowie, MD;  Location: [facility];  Service: General;  Laterality: Left;  . BREAST SURGERY    . cataract surgery  2009   right eye  . COLPOSCOPY    . HAMMER TOE SURGERY  2004   bilateral  . ORIF WRIST FRACTURE  6/14    gsc  . TONSILLECTOMY      Current Outpatient Medications  Medication Sig Dispense Refill  . amLODipine (NORVASC) 5 MG tablet Take 5 mg by mouth daily.    Marland Kitchen aspirin EC 81 MG tablet Take 81 mg by mouth daily.    Marland Kitchen atorvastatin (LIPITOR) 20 MG tablet Take 20 mg by mouth daily.    . dorzolamide-timolol (COSOPT) 22.3-6.8 MG/ML ophthalmic solution Place 1 drop into both eyes daily.    Marland Kitchen lisinopril-hydrochlorothiazide (PRINZIDE,ZESTORETIC) 20-12.5 MG tablet Take 2 tablets by mouth daily.     . metFORMIN (GLUCOPHAGE) 1000 MG tablet Take 1,000 mg by mouth 2 (two) times daily with a meal.     No current facility-administered medications for this visit.    Family History  Problem Relation Age of Onset  . Breast cancer Mother   . Diabetes Mother   . Hypertension Mother   . Hypertension Maternal Grandmother   . Glaucoma Maternal Grandmother   Mom was 89 with breast cancer diagnosis.  Review of Systems  All other systems reviewed and are negative.   Exam:   BP 132/72   Pulse (!) 105   Ht 5\' 4"  (1.626 m)   Wt 189 lb (85.7 kg)   SpO2 98%   BMI 32.44 kg/m   Weight change: @WEIGHTCHANGE @ Height:  Height: 5\' 4"  (162.6 cm)  Ht Readings from Last 3 Encounters:  01/31/20 5\' 4"  (1.626 m)  01/25/19 5\' 4"  (1.626 m)  09/08/17 5' 3.75" (1.619 m)    General appearance: alert, cooperative and appears stated age Head: Normocephalic, without obvious abnormality, atraumatic Neck: no adenopathy, supple, symmetrical, trachea midline and thyroid normal to inspection and palpation Lungs: clear to auscultation bilaterally Cardiovascular: regular rate and rhythm Breasts: normal appearance, no masses or tenderness Abdomen: soft, non-tender; non distended,  no masses,  no organomegaly Extremities: extremities normal, atraumatic, no cyanosis or edema Skin: Skin color, texture, turgor normal. No rashes or lesions Lymph nodes: Cervical, supraclavicular, and axillary nodes normal. No abnormal inguinal  nodes palpated Neurologic: Grossly normal   Pelvic: External genitalia:  no lesions              Urethra:  normal appearing urethra with no masses, tenderness or lesions              Bartholins and Skenes: normal                 Vagina: atrophic appearing vagina with normal color and discharge, no lesions              Cervix: no lesions               Bimanual Exam:  Uterus:  normal size, contour, position, consistency, mobility, non-tender              Adnexa: no mass, fullness, tenderness               Rectovaginal: Confirms               Anus:  normal sphincter tone, no lesions  Shanon Petty chaperoned for the exam.  A:  Well Woman with normal exam  P:   Will do a pap next year with hpv (discussed guidelines and reasoning behind guidlines)  Mammogram just done  Discussed breast self exam  Discussed calcium and vit D intake  Colonoscopy scheduled  Labs with primary MD

## 2020-01-31 ENCOUNTER — Ambulatory Visit (INDEPENDENT_AMBULATORY_CARE_PROVIDER_SITE_OTHER): Payer: Federal, State, Local not specified - PPO | Admitting: Obstetrics and Gynecology

## 2020-01-31 ENCOUNTER — Encounter: Payer: Self-pay | Admitting: Obstetrics and Gynecology

## 2020-01-31 ENCOUNTER — Other Ambulatory Visit: Payer: Self-pay

## 2020-01-31 VITALS — BP 132/72 | HR 105 | Ht 64.0 in | Wt 189.0 lb

## 2020-01-31 DIAGNOSIS — Z01419 Encounter for gynecological examination (general) (routine) without abnormal findings: Secondary | ICD-10-CM | POA: Diagnosis not present

## 2020-01-31 DIAGNOSIS — Z803 Family history of malignant neoplasm of breast: Secondary | ICD-10-CM | POA: Diagnosis not present

## 2020-01-31 NOTE — Patient Instructions (Signed)

## 2020-03-07 DIAGNOSIS — Z1159 Encounter for screening for other viral diseases: Secondary | ICD-10-CM | POA: Diagnosis not present

## 2020-03-12 DIAGNOSIS — Z8371 Family history of colonic polyps: Secondary | ICD-10-CM | POA: Diagnosis not present

## 2020-03-12 DIAGNOSIS — K635 Polyp of colon: Secondary | ICD-10-CM | POA: Diagnosis not present

## 2020-03-12 DIAGNOSIS — Z1211 Encounter for screening for malignant neoplasm of colon: Secondary | ICD-10-CM | POA: Diagnosis not present

## 2020-03-28 DIAGNOSIS — H401131 Primary open-angle glaucoma, bilateral, mild stage: Secondary | ICD-10-CM | POA: Diagnosis not present

## 2020-07-18 DIAGNOSIS — E119 Type 2 diabetes mellitus without complications: Secondary | ICD-10-CM | POA: Diagnosis not present

## 2020-07-18 DIAGNOSIS — Z0000 Encounter for general adult medical examination without abnormal findings: Secondary | ICD-10-CM | POA: Diagnosis not present

## 2020-09-26 DIAGNOSIS — E119 Type 2 diabetes mellitus without complications: Secondary | ICD-10-CM | POA: Diagnosis not present

## 2020-09-26 DIAGNOSIS — H5213 Myopia, bilateral: Secondary | ICD-10-CM | POA: Diagnosis not present

## 2020-09-26 DIAGNOSIS — H2512 Age-related nuclear cataract, left eye: Secondary | ICD-10-CM | POA: Diagnosis not present

## 2020-09-26 DIAGNOSIS — H401131 Primary open-angle glaucoma, bilateral, mild stage: Secondary | ICD-10-CM | POA: Diagnosis not present

## 2020-10-22 DIAGNOSIS — M25511 Pain in right shoulder: Secondary | ICD-10-CM | POA: Diagnosis not present

## 2020-11-15 IMAGING — MR MR HEAD W/O CM
10 series · 48 of 48 positions shown · non-contrast
Comparison: None.

CLINICAL DATA: Recurrent headache over the last 3 months. Vertex
headaches.

EXAM:
MRI HEAD WITHOUT CONTRAST
TECHNIQUE: Multiplanar, multiecho pulse sequences of the brain and surrounding
structures were obtained without intravenous contrast.

[Series 2: T1 · sagittal · 5.0mm · 0.45mm/px · 2 of 21 slices shown]
[im 1/21]
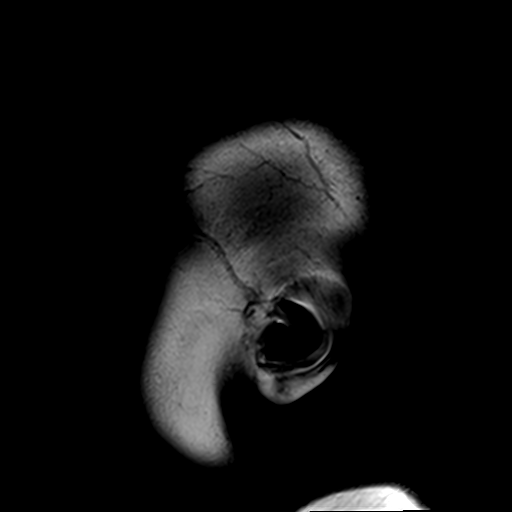
[im 21/21]
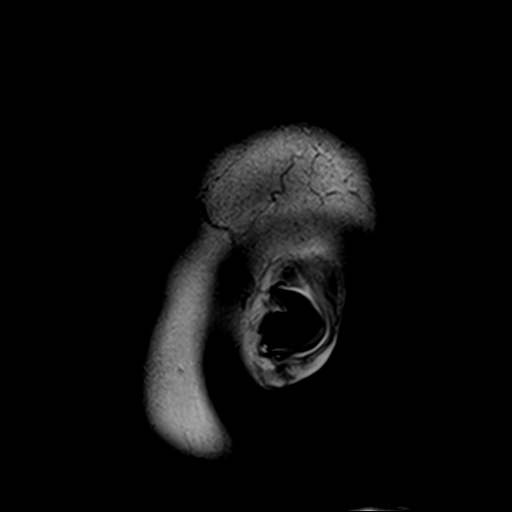

[Series 3: DWI · axial · 3.0mm · 1.80mm/px · z∈[-57,+90]mm · 9 of 100 slices shown (1 of 4)]
[im 1/100]
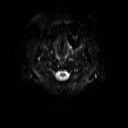
[im 13/100]
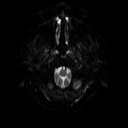
[im 25/100]
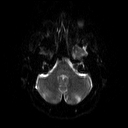
[im 38/100]
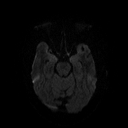
[im 50/100]
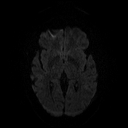
[im 62/100]
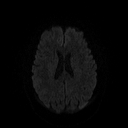
[im 75/100]
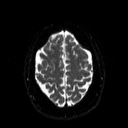
[im 87/100]
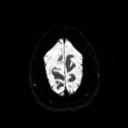
[im 100/100]
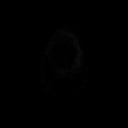

[Series 4: DWI · axial · 3.0mm · 1.80mm/px · z∈[-57,+90]mm · 5 of 50 slices shown (2 of 4)]
[im 1/50]
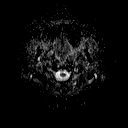
[im 13/50]
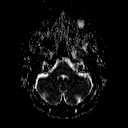
[im 25/50]
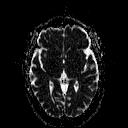
[im 37/50]
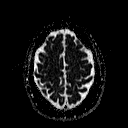
[im 50/50]
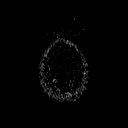

[Series 5: DWI · coronal · 5.0mm · 1.80mm/px · 6 of 65 slices shown (3 of 4)]
[im 1/65]
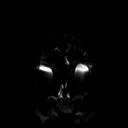
[im 13/65]
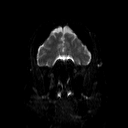
[im 26/65]
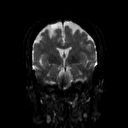
[im 39/65]
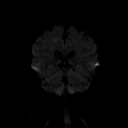
[im 52/65]
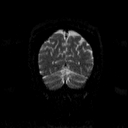
[im 65/65]
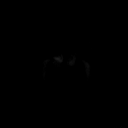

[Series 6: DWI · coronal · 5.0mm · 1.80mm/px · 3 of 34 slices shown (4 of 4)]
[im 1/34]
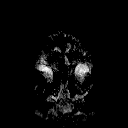
[im 17/34]
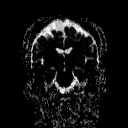
[im 34/34]
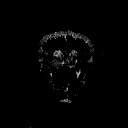

[Series 7: T2 · axial · 5.0mm · 0.51mm/px · z∈[-51,+89]mm · 2 of 21 slices shown (1 of 2)]
[im 1/21]
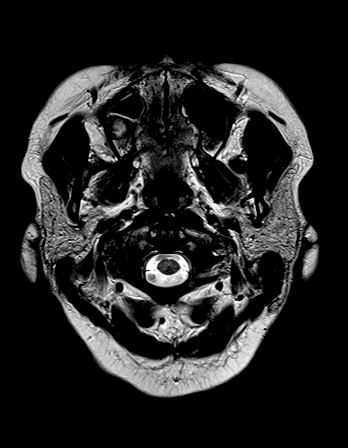
[im 21/21]
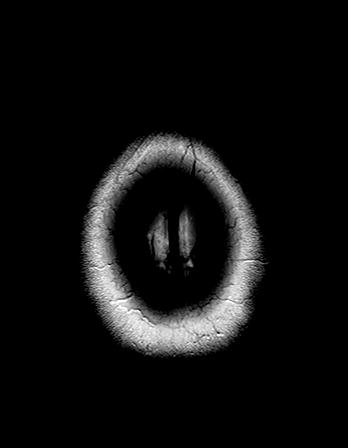

[Series 8: FLAIR · axial · 3.0mm · 0.45mm/px · z∈[-53,+86]mm · 3 of 31 slices shown]
[im 1/31]
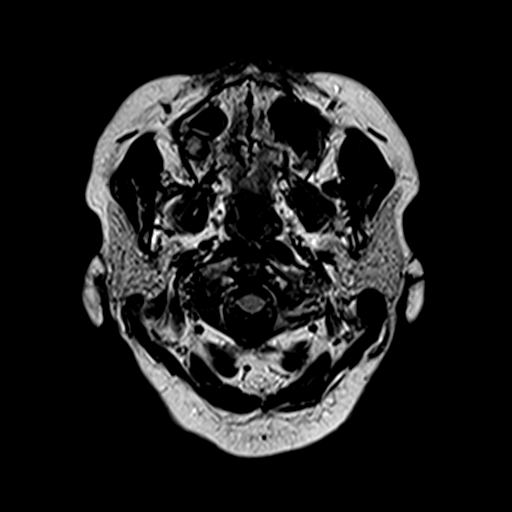
[im 16/31]
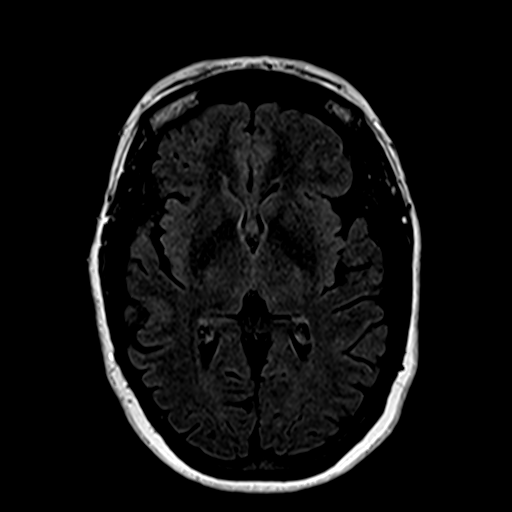
[im 31/31]
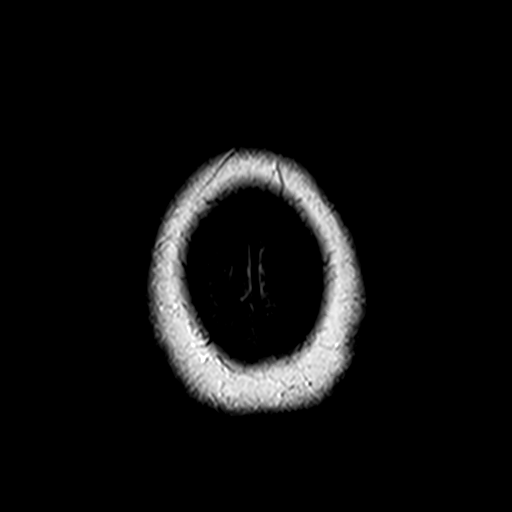

[Series 10: swi_images · axial · 5.0mm · 0.90mm/px · z∈[-55,+89]mm · 3 of 30 slices shown]
[im 1/30]
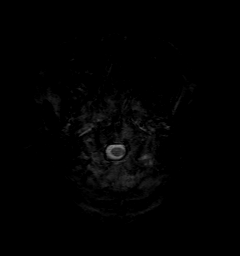
[im 15/30]
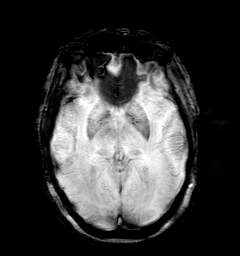
[im 30/30]
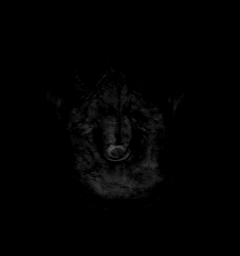

[Series 11: t1_mpr_tra · axial · 1.0mm · 0.71mm/px · z∈[-52,+91]mm · 13 of 144 slices shown]
[im 1/144]
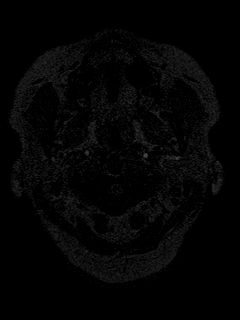
[im 12/144]
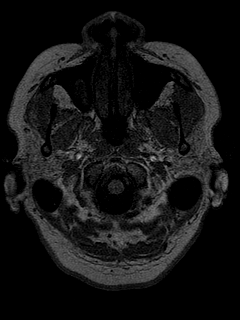
[im 24/144]
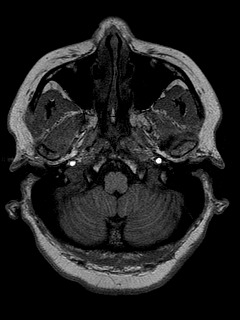
[im 36/144]
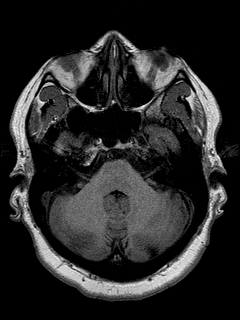
[im 48/144]
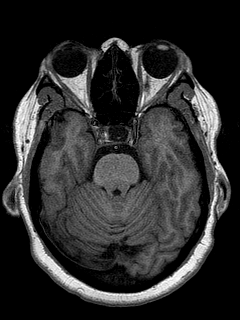
[im 60/144]
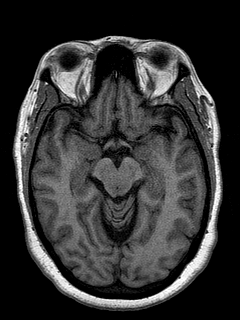
[im 72/144]
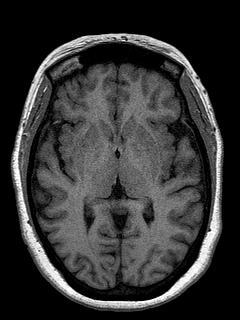
[im 84/144]
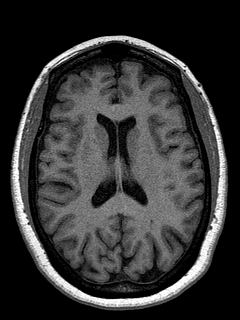
[im 96/144]
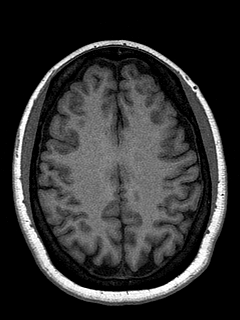
[im 108/144]
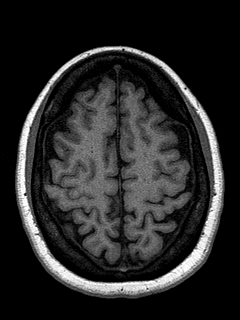
[im 120/144]
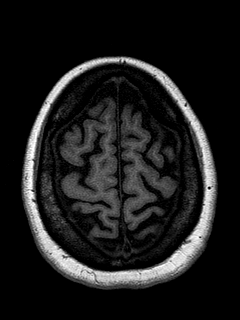
[im 132/144]
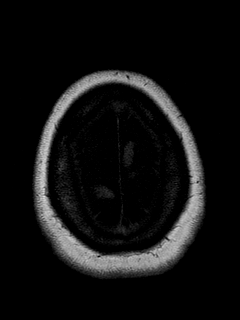
[im 144/144]
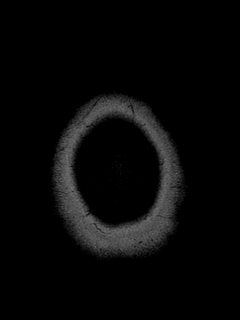

[Series 12: T2 · coronal · 5.0mm · 0.45mm/px · 2 of 27 slices shown (2 of 2)]
[im 1/27]
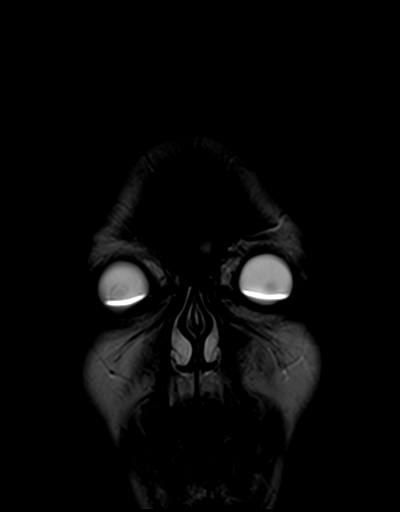
[im 27/27]
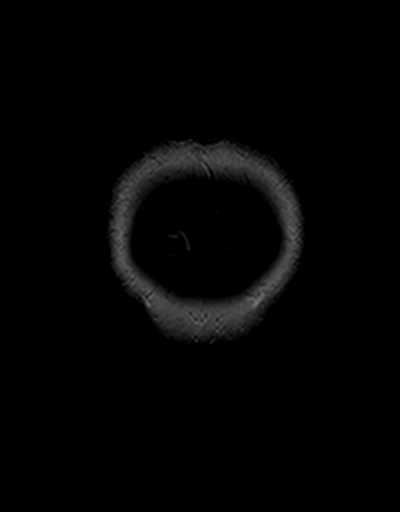

[48 of 48 positions shown; findings below may reference images not displayed]

FINDINGS: Brain: The brain has a normal appearance without evidence of
malformation, atrophy, old or acute small or large vessel
infarction, mass lesion, hemorrhage, hydrocephalus or extra-axial
collection.

Vascular: Major vessels at the base of the brain show flow. Venous
sinuses appear patent.

Skull and upper cervical spine: Normal.

Sinuses/Orbits: The sinuses are clear except for a small opacified
frontal air cell and a dentigerous or retention cyst at the floor of
the right maxillary sinus.

Other: None significant.
IMPRESSION: No intracranial abnormality. No likely cause of headache identified.

Single opacified frontal sinus air cell, probably subclinical.

## 2021-01-15 DIAGNOSIS — I10 Essential (primary) hypertension: Secondary | ICD-10-CM | POA: Diagnosis not present

## 2021-01-15 DIAGNOSIS — E7800 Pure hypercholesterolemia, unspecified: Secondary | ICD-10-CM | POA: Diagnosis not present

## 2021-01-15 DIAGNOSIS — E119 Type 2 diabetes mellitus without complications: Secondary | ICD-10-CM | POA: Diagnosis not present

## 2021-01-15 DIAGNOSIS — E669 Obesity, unspecified: Secondary | ICD-10-CM | POA: Diagnosis not present

## 2021-01-28 DIAGNOSIS — Z1231 Encounter for screening mammogram for malignant neoplasm of breast: Secondary | ICD-10-CM | POA: Diagnosis not present

## 2021-02-04 NOTE — Progress Notes (Signed)
60 y.o. G28P2002 Married Black or [place] American Not Hispanic or Latino female here for annual exam.  No vaginal bleeding. Sexually active, no pain. No vasomotor c/o.    H/O urge incontinence with a full bladder, stable and tolerable.  H/O constipation, helped with miralax.   No LMP recorded. Patient is postmenopausal.          Sexually active: Yes.    The current method of family planning is post menopausal status.    Exercising: Yes.     walking Smoker:  no  Health Maintenance: Pap:  09-08-17 neg HPV HR neg History of abnormal Pap:  yes 2004 had colpo  MMG:  2 weeks ago at [facility], will call for report  BMD:   none  Colonoscopy: 2021, 5 year f/u per patient. Dr. Watt Climes  TDaP:  02/24/16  Gardasil: na   reports that she has never smoked. She has never used smokeless tobacco. She reports current alcohol use. She reports that she does not use drugs. Just occasional ETOH. 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 2 kids, son lives at home, has Schizophrenia. 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 She is taking care of her granddaughter.   Past Medical History:  Diagnosis Date   Asthma    Contact lens/glasses fitting    contact lt eye   Diabetes mellitus without complication ([place])    Genital warts    Hypercholesteremia    Hypertension    Increased pressure in the eye    not diagnosed with glaucoma yet   Leg pain     Past Surgical History:  Procedure Laterality Date   BREAST LUMPECTOMY WITH NEEDLE LOCALIZATION Left 07/05/2013   Procedure: LEFT BREAST LUMPECTOMY WITH NEEDLE LOCALIZATION;  Surgeon: Harl Bowie, MD;  Location: [facility];  Service: General;  Laterality: Left;   BREAST SURGERY     cataract surgery  2009   right eye   COLPOSCOPY     HAMMER TOE SURGERY  2004   bilateral   ORIF WRIST FRACTURE  6/14   gsc   TONSILLECTOMY      Current Outpatient Medications  Medication Sig Dispense  Refill   amLODipine (NORVASC) 5 MG tablet Take 5 mg by mouth daily.     aspirin EC 81 MG tablet Take 81 mg by mouth daily.     atorvastatin (LIPITOR) 20 MG tablet Take 20 mg by mouth daily.     dorzolamide-timolol (COSOPT) 22.3-6.8 MG/ML ophthalmic solution Place 1 drop into both eyes daily.     lisinopril-hydrochlorothiazide (PRINZIDE,ZESTORETIC) 20-12.5 MG tablet Take 2 tablets by mouth daily.      metFORMIN (GLUCOPHAGE) 1000 MG tablet Take 1,000 mg by mouth 2 (two) times daily with a meal.     No current facility-administered medications for this visit.    Family History  Problem Relation Age of Onset   Breast cancer Mother    Diabetes Mother    Hypertension Mother    Hypertension Maternal Grandmother    Glaucoma Maternal Grandmother   Mom had breast cancer at 16, Maternal Aunt breast cancer 25. Mom is one of 4 girl  Review of Systems  All other systems reviewed and are negative.  Exam:   BP (!) 148/90 (BP Location: Right Arm, Patient Position: Sitting, Cuff Size: Large)   Pulse 76   Resp 14   Ht '5\' 4"'$  (1.626 m)   Wt 175 lb (79.4  kg)   BMI 30.04 kg/m   Weight change: '@WEIGHTCHANGE'$ @ Height:   Height: '5\' 4"'$  (162.6 cm)  Ht Readings from Last 3 Encounters:  02/05/21 '5\' 4"'$  (1.626 m)  01/31/20 '5\' 4"'$  (1.626 m)  01/25/19 '5\' 4"'$  (1.626 m)    General appearance: alert, cooperative and appears stated age Head: Normocephalic, without obvious abnormality, atraumatic Neck: no adenopathy, supple, symmetrical, trachea midline and thyroid normal to inspection and palpation Lungs: clear to auscultation bilaterally Cardiovascular: regular rate and rhythm Breasts: normal appearance, no masses or tenderness Abdomen: soft, non-tender; non distended,  no masses,  no organomegaly Extremities: extremities normal, atraumatic, no cyanosis or edema Skin: Skin color, texture, turgor normal. No rashes or lesions Lymph nodes: Cervical, supraclavicular, and axillary nodes normal. No abnormal  inguinal nodes palpated Neurologic: Grossly normal   Pelvic: External genitalia:  no lesions              Urethra:  normal appearing urethra with no masses, tenderness or lesions              Bartholins and Skenes: normal                 Vagina: normal appearing vagina with normal color and discharge, no lesions              Cervix: no lesions               Bimanual Exam:  Uterus:   no masses or tenderness              Adnexa: no mass, fullness, tenderness               Rectovaginal: Confirms               Anus:  normal sphincter tone, no lesions  Karmen Bongo chaperoned for the exam.  1. Well woman exam Discussed breast self exam Discussed calcium and vit D intake Labs with primary Mammogram just done Colonoscopy UTD  2. Screening for cervical cancer Desires pap today - Cytology - PAP

## 2021-02-05 ENCOUNTER — Ambulatory Visit (INDEPENDENT_AMBULATORY_CARE_PROVIDER_SITE_OTHER): Payer: Federal, State, Local not specified - PPO | Admitting: Obstetrics and Gynecology

## 2021-02-05 ENCOUNTER — Other Ambulatory Visit: Payer: Self-pay

## 2021-02-05 ENCOUNTER — Encounter: Payer: Self-pay | Admitting: Obstetrics and Gynecology

## 2021-02-05 ENCOUNTER — Other Ambulatory Visit (HOSPITAL_COMMUNITY)
Admission: RE | Admit: 2021-02-05 | Discharge: 2021-02-05 | Disposition: A | Discharge status: Home / Self Care | Payer: Federal, State, Local not specified - PPO | Source: Ambulatory Visit | Attending: Obstetrics and Gynecology | Admitting: Obstetrics and Gynecology

## 2021-02-05 VITALS — BP 148/90 | HR 76 | Resp 14 | Ht 64.0 in | Wt 175.0 lb

## 2021-02-05 DIAGNOSIS — Z124 Encounter for screening for malignant neoplasm of cervix: Secondary | ICD-10-CM | POA: Diagnosis not present

## 2021-02-05 DIAGNOSIS — Z01419 Encounter for gynecological examination (general) (routine) without abnormal findings: Secondary | ICD-10-CM | POA: Diagnosis not present

## 2021-02-05 NOTE — Patient Instructions (Signed)

## 2021-02-06 LAB — CYTOLOGY - PAP
Comment: NEGATIVE
Diagnosis: NEGATIVE
High risk HPV: NEGATIVE

## 2021-04-28 DIAGNOSIS — E1165 Type 2 diabetes mellitus with hyperglycemia: Secondary | ICD-10-CM | POA: Diagnosis not present

## 2021-05-22 DIAGNOSIS — H401131 Primary open-angle glaucoma, bilateral, mild stage: Secondary | ICD-10-CM | POA: Diagnosis not present

## 2021-08-11 DIAGNOSIS — E7800 Pure hypercholesterolemia, unspecified: Secondary | ICD-10-CM | POA: Diagnosis not present

## 2021-08-11 DIAGNOSIS — E119 Type 2 diabetes mellitus without complications: Secondary | ICD-10-CM | POA: Diagnosis not present

## 2021-08-11 DIAGNOSIS — Z0000 Encounter for general adult medical examination without abnormal findings: Secondary | ICD-10-CM | POA: Diagnosis not present

## 2021-08-11 DIAGNOSIS — I10 Essential (primary) hypertension: Secondary | ICD-10-CM | POA: Diagnosis not present

## 2021-11-25 DIAGNOSIS — H5212 Myopia, left eye: Secondary | ICD-10-CM | POA: Diagnosis not present

## 2021-11-25 DIAGNOSIS — E119 Type 2 diabetes mellitus without complications: Secondary | ICD-10-CM | POA: Diagnosis not present

## 2021-11-25 DIAGNOSIS — H2512 Age-related nuclear cataract, left eye: Secondary | ICD-10-CM | POA: Diagnosis not present

## 2021-11-25 DIAGNOSIS — H401131 Primary open-angle glaucoma, bilateral, mild stage: Secondary | ICD-10-CM | POA: Diagnosis not present

## 2022-01-20 DIAGNOSIS — M7541 Impingement syndrome of right shoulder: Secondary | ICD-10-CM | POA: Diagnosis not present

## 2022-01-28 DIAGNOSIS — M25511 Pain in right shoulder: Secondary | ICD-10-CM | POA: Diagnosis not present

## 2022-02-02 NOTE — Progress Notes (Addendum)
61 y.o. G12P2002 Married Black or [place] American Not Hispanic or Latino female here for annual exam.  Vaginal bleeding. No dyspareunia as long as she uses a lubricant.   She is having some vaginal irritation. Symptoms started 2 days ago, more on the left side of the vulva. No discharge, no itching.    H/O urge incontinence with a full bladder, stable and tolerable.   H/O constipation, helped with miralax.   She has a torn rotator cuff on the right, will need surgery.   No LMP recorded. Patient is postmenopausal.          Sexually active: Yes.    The current method of family planning is post menopausal status.    Exercising: Yes.     Walking  Smoker:  no  Health Maintenance: Pap: 02/05/21 wnl Hr Hpv Neg, 09-08-17 neg HPV HR neg History of abnormal Pap:  :  yes 2004 had colpo, no surgery on her cervix.  MMG:  02/05/22 results pending BMD:  none  Colonoscopy: 2021, 5 year f/u per patient. Dr. Watt Climes  TDaP:  02/24/16  Gardasil: na   reports that she has never smoked. She has never used smokeless tobacco. She reports current alcohol use. She reports that she does not use drugs. Just occasional ETOH. 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 2 kids, son lives at home, has Schizophrenia. 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 She is taking care of her granddaughter.   Past Medical History:  Diagnosis Date   Asthma    Contact lens/glasses fitting    contact lt eye   Diabetes mellitus without complication ([place])    Genital warts    Hypercholesteremia    Hypertension    Increased pressure in the eye    not diagnosed with glaucoma yet   Leg pain     Past Surgical History:  Procedure Laterality Date   BREAST LUMPECTOMY WITH NEEDLE LOCALIZATION Left 07/05/2013   Procedure: LEFT BREAST LUMPECTOMY WITH NEEDLE LOCALIZATION;  Surgeon: Harl Bowie, MD;  Location: [facility];  Service: General;  Laterality:  Left;   BREAST SURGERY     cataract surgery  2009   right eye   COLPOSCOPY     HAMMER TOE SURGERY  2004   bilateral   ORIF WRIST FRACTURE  6/14   gsc   TONSILLECTOMY      Current Outpatient Medications  Medication Sig Dispense Refill   amLODipine (NORVASC) 5 MG tablet Take 5 mg by mouth daily.     aspirin EC 81 MG tablet Take 81 mg by mouth daily.     atorvastatin (LIPITOR) 20 MG tablet Take 20 mg by mouth daily.     dorzolamide-timolol (COSOPT) 22.3-6.8 MG/ML ophthalmic solution Place 1 drop into both eyes daily.     metFORMIN (GLUCOPHAGE) 1000 MG tablet Take 1,000 mg by mouth 2 (two) times daily with a meal.     No current facility-administered medications for this visit.    Family History  Problem Relation Age of Onset   Breast cancer Mother 58   Diabetes Mother    Hypertension Mother    Breast cancer Maternal Aunt 6   Hypertension Maternal Grandmother    Glaucoma Maternal Grandmother     Review of Systems  Exam:   BP 132/68   Pulse 77   Ht '5\' 4"'$  (1.626 m)   Wt 185 lb (83.9 kg)  SpO2 100%   BMI 31.76 kg/m   Weight change: '@WEIGHTCHANGE'$ @ Height:   Height: '5\' 4"'$  (162.6 cm)  Ht Readings from Last 3 Encounters:  02/10/22 '5\' 4"'$  (1.626 m)  02/05/21 '5\' 4"'$  (1.626 m)  01/31/20 '5\' 4"'$  (1.626 m)    General appearance: alert, cooperative and appears stated age Head: Normocephalic, without obvious abnormality, atraumatic Neck: no adenopathy, supple, symmetrical, trachea midline and thyroid normal to inspection and palpation Lungs: clear to auscultation bilaterally Cardiovascular: regular rate and rhythm Breasts: normal appearance, no masses or tenderness Abdomen: soft, non-tender; non distended,  no masses,  no organomegaly Extremities: extremities normal, atraumatic, no cyanosis or edema Skin: Skin color, texture, turgor normal. No rashes or lesions Lymph nodes: Cervical, supraclavicular, and axillary nodes normal. No abnormal inguinal nodes palpated Neurologic:  Grossly normal   Pelvic: External genitalia:  no lesions, area of irritation looks normal              Urethra:  normal appearing urethra with no masses, tenderness or lesions              Bartholins and Skenes: normal                 Vagina: normal appearing vagina with normal color and discharge, no lesions              Cervix: no lesions               Bimanual Exam:  Uterus:   no masses or tenderness              Adnexa: no mass, fullness, tenderness               Rectovaginal: Confirms               Anus:  normal sphincter tone, no lesions  Gae Dry chaperoned for the exam.  1. Well woman exam Discussed breast self exam Discussed calcium and vit D intake Mammogram results pending Pap and colonoscopy are UTD  2. Vulvar irritation No abnormal findings on exam She will try using Vaseline Will call if she wants to try some local steroid ointment Vulvar skin care information given  Addendum: Error above, it should read no vaginal bleeding.

## 2022-02-03 DIAGNOSIS — M25511 Pain in right shoulder: Secondary | ICD-10-CM | POA: Diagnosis not present

## 2022-02-03 DIAGNOSIS — Z1231 Encounter for screening mammogram for malignant neoplasm of breast: Secondary | ICD-10-CM | POA: Diagnosis not present

## 2022-02-04 ENCOUNTER — Encounter: Payer: Self-pay | Admitting: Obstetrics and Gynecology

## 2022-02-05 ENCOUNTER — Other Ambulatory Visit: Payer: Self-pay | Admitting: Obstetrics and Gynecology

## 2022-02-09 DIAGNOSIS — E669 Obesity, unspecified: Secondary | ICD-10-CM | POA: Diagnosis not present

## 2022-02-09 DIAGNOSIS — E7800 Pure hypercholesterolemia, unspecified: Secondary | ICD-10-CM | POA: Diagnosis not present

## 2022-02-09 DIAGNOSIS — E119 Type 2 diabetes mellitus without complications: Secondary | ICD-10-CM | POA: Diagnosis not present

## 2022-02-09 DIAGNOSIS — I10 Essential (primary) hypertension: Secondary | ICD-10-CM | POA: Diagnosis not present

## 2022-02-10 ENCOUNTER — Ambulatory Visit (INDEPENDENT_AMBULATORY_CARE_PROVIDER_SITE_OTHER): Payer: Federal, State, Local not specified - PPO | Admitting: Obstetrics and Gynecology

## 2022-02-10 ENCOUNTER — Encounter: Payer: Self-pay | Admitting: Obstetrics and Gynecology

## 2022-02-10 VITALS — BP 132/68 | HR 77 | Ht 64.0 in | Wt 185.0 lb

## 2022-02-10 DIAGNOSIS — Z01419 Encounter for gynecological examination (general) (routine) without abnormal findings: Secondary | ICD-10-CM

## 2022-02-10 DIAGNOSIS — Z8371 Family history of colonic polyps: Secondary | ICD-10-CM | POA: Insufficient documentation

## 2022-02-10 DIAGNOSIS — K5900 Constipation, unspecified: Secondary | ICD-10-CM | POA: Insufficient documentation

## 2022-02-10 DIAGNOSIS — N9089 Other specified noninflammatory disorders of vulva and perineum: Secondary | ICD-10-CM

## 2022-02-10 DIAGNOSIS — M545 Low back pain, unspecified: Secondary | ICD-10-CM | POA: Insufficient documentation

## 2022-02-10 DIAGNOSIS — E669 Obesity, unspecified: Secondary | ICD-10-CM | POA: Insufficient documentation

## 2022-02-10 NOTE — Patient Instructions (Signed)

## 2022-02-11 ENCOUNTER — Encounter: Payer: Self-pay | Admitting: Obstetrics and Gynecology

## 2022-02-16 ENCOUNTER — Telehealth: Payer: Self-pay

## 2022-02-16 NOTE — Telephone Encounter (Signed)
Please thank the patient for catching my error. I meant to write no vaginal bleeding. I have added an addendum to her note.

## 2022-02-16 NOTE — Telephone Encounter (Signed)
Patient called because she noticed in her AVS it mentioned "vaginal bleeding".  She said she has had no vaginal bleeding since in menopause and did not mention that at visit.  I told her I will pass this information on to Dr. Talbert Nan.

## 2022-02-17 NOTE — Telephone Encounter (Signed)
Spoke with patient and informed her. °

## 2022-04-06 DIAGNOSIS — M7521 Bicipital tendinitis, right shoulder: Secondary | ICD-10-CM | POA: Diagnosis not present

## 2022-04-06 DIAGNOSIS — M19011 Primary osteoarthritis, right shoulder: Secondary | ICD-10-CM | POA: Diagnosis not present

## 2022-04-06 DIAGNOSIS — M7541 Impingement syndrome of right shoulder: Secondary | ICD-10-CM | POA: Diagnosis not present

## 2022-04-06 DIAGNOSIS — X58XXXA Exposure to other specified factors, initial encounter: Secondary | ICD-10-CM | POA: Diagnosis not present

## 2022-04-06 DIAGNOSIS — M75121 Complete rotator cuff tear or rupture of right shoulder, not specified as traumatic: Secondary | ICD-10-CM | POA: Diagnosis not present

## 2022-04-06 DIAGNOSIS — I890 Lymphedema, not elsewhere classified: Secondary | ICD-10-CM | POA: Diagnosis not present

## 2022-04-06 DIAGNOSIS — S43431A Superior glenoid labrum lesion of right shoulder, initial encounter: Secondary | ICD-10-CM | POA: Diagnosis not present

## 2022-04-06 DIAGNOSIS — G8918 Other acute postprocedural pain: Secondary | ICD-10-CM | POA: Diagnosis not present

## 2022-04-06 DIAGNOSIS — Y999 Unspecified external cause status: Secondary | ICD-10-CM | POA: Diagnosis not present

## 2022-04-06 DIAGNOSIS — Z4789 Encounter for other orthopedic aftercare: Secondary | ICD-10-CM | POA: Diagnosis not present

## 2022-04-06 DIAGNOSIS — M25511 Pain in right shoulder: Secondary | ICD-10-CM | POA: Diagnosis not present

## 2022-04-06 DIAGNOSIS — M67813 Other specified disorders of tendon, right shoulder: Secondary | ICD-10-CM | POA: Diagnosis not present

## 2022-04-16 DIAGNOSIS — M19011 Primary osteoarthritis, right shoulder: Secondary | ICD-10-CM | POA: Diagnosis not present

## 2022-04-16 DIAGNOSIS — Z4789 Encounter for other orthopedic aftercare: Secondary | ICD-10-CM | POA: Diagnosis not present

## 2022-04-16 DIAGNOSIS — M75101 Unspecified rotator cuff tear or rupture of right shoulder, not specified as traumatic: Secondary | ICD-10-CM | POA: Diagnosis not present

## 2022-04-16 DIAGNOSIS — M25511 Pain in right shoulder: Secondary | ICD-10-CM | POA: Diagnosis not present

## 2022-04-16 DIAGNOSIS — I890 Lymphedema, not elsewhere classified: Secondary | ICD-10-CM | POA: Diagnosis not present

## 2022-04-23 DIAGNOSIS — M75101 Unspecified rotator cuff tear or rupture of right shoulder, not specified as traumatic: Secondary | ICD-10-CM | POA: Diagnosis not present

## 2022-04-23 DIAGNOSIS — M25511 Pain in right shoulder: Secondary | ICD-10-CM | POA: Diagnosis not present

## 2022-04-30 DIAGNOSIS — M75101 Unspecified rotator cuff tear or rupture of right shoulder, not specified as traumatic: Secondary | ICD-10-CM | POA: Diagnosis not present

## 2022-04-30 DIAGNOSIS — M25511 Pain in right shoulder: Secondary | ICD-10-CM | POA: Diagnosis not present

## 2022-05-07 DIAGNOSIS — M75101 Unspecified rotator cuff tear or rupture of right shoulder, not specified as traumatic: Secondary | ICD-10-CM | POA: Diagnosis not present

## 2022-05-07 DIAGNOSIS — M25511 Pain in right shoulder: Secondary | ICD-10-CM | POA: Diagnosis not present

## 2022-05-25 DIAGNOSIS — H401131 Primary open-angle glaucoma, bilateral, mild stage: Secondary | ICD-10-CM | POA: Diagnosis not present

## 2022-06-24 DIAGNOSIS — M25511 Pain in right shoulder: Secondary | ICD-10-CM | POA: Diagnosis not present

## 2022-06-24 DIAGNOSIS — M75101 Unspecified rotator cuff tear or rupture of right shoulder, not specified as traumatic: Secondary | ICD-10-CM | POA: Diagnosis not present

## 2022-07-07 DIAGNOSIS — M75101 Unspecified rotator cuff tear or rupture of right shoulder, not specified as traumatic: Secondary | ICD-10-CM | POA: Diagnosis not present

## 2022-07-07 DIAGNOSIS — M25511 Pain in right shoulder: Secondary | ICD-10-CM | POA: Diagnosis not present

## 2022-08-24 DIAGNOSIS — E7800 Pure hypercholesterolemia, unspecified: Secondary | ICD-10-CM | POA: Diagnosis not present

## 2022-08-24 DIAGNOSIS — I10 Essential (primary) hypertension: Secondary | ICD-10-CM | POA: Diagnosis not present

## 2022-08-24 DIAGNOSIS — Z0000 Encounter for general adult medical examination without abnormal findings: Secondary | ICD-10-CM | POA: Diagnosis not present

## 2022-08-24 DIAGNOSIS — E1169 Type 2 diabetes mellitus with other specified complication: Secondary | ICD-10-CM | POA: Diagnosis not present

## 2022-10-21 DIAGNOSIS — Z4789 Encounter for other orthopedic aftercare: Secondary | ICD-10-CM | POA: Diagnosis not present

## 2022-12-02 DIAGNOSIS — E119 Type 2 diabetes mellitus without complications: Secondary | ICD-10-CM | POA: Diagnosis not present

## 2022-12-02 DIAGNOSIS — H2512 Age-related nuclear cataract, left eye: Secondary | ICD-10-CM | POA: Diagnosis not present

## 2022-12-02 DIAGNOSIS — H401131 Primary open-angle glaucoma, bilateral, mild stage: Secondary | ICD-10-CM | POA: Diagnosis not present

## 2022-12-02 DIAGNOSIS — H5212 Myopia, left eye: Secondary | ICD-10-CM | POA: Diagnosis not present

## 2023-02-09 DIAGNOSIS — Z1231 Encounter for screening mammogram for malignant neoplasm of breast: Secondary | ICD-10-CM | POA: Diagnosis not present

## 2023-02-23 DIAGNOSIS — E1169 Type 2 diabetes mellitus with other specified complication: Secondary | ICD-10-CM | POA: Diagnosis not present

## 2023-02-23 DIAGNOSIS — I10 Essential (primary) hypertension: Secondary | ICD-10-CM | POA: Diagnosis not present

## 2023-02-23 DIAGNOSIS — E669 Obesity, unspecified: Secondary | ICD-10-CM | POA: Diagnosis not present

## 2023-02-23 DIAGNOSIS — E7800 Pure hypercholesterolemia, unspecified: Secondary | ICD-10-CM | POA: Diagnosis not present

## 2023-03-02 DIAGNOSIS — L03031 Cellulitis of right toe: Secondary | ICD-10-CM | POA: Diagnosis not present

## 2023-03-02 DIAGNOSIS — S90211A Contusion of right great toe with damage to nail, initial encounter: Secondary | ICD-10-CM | POA: Diagnosis not present

## 2023-03-17 NOTE — Progress Notes (Signed)
62 y.o. G76P2002 Married [place] American female here for annual exam.    Denies vaginal bleeding or bleeding with intercourse.   2 children (45 and 33 yo) and one grandchild. Patient and her husband are retired.  Going to [place]  PCP:  Dr. Clovis Riley Deboraha Sprang  No LMP recorded. Patient is postmenopausal.           Sexually active: Yes.    The current method of family planning is post menopausal status.    Exercising: Yes.     walking Smoker:  no  Health Maintenance: Pap:  02/05/21 neg: HR HPV neg, 09/08/17 neg: HR HPV neg History of abnormal Pap:  yes,  2004 had colpo, no surgery on her cervix.  MMG: 01/2023 per pt, 02/11/22 Breast density Cat A, BI-RADS CAT 1 neg Colonoscopy:  colonoscopy every 5 years with Dr. Ewing Schlein at [place].  Has polyps. Due at age 74.   BMD:   n/a  Result  n/a TDaP:  02/24/16 Gardasil:   no HIV: n/a Hep C: n/a Screening Labs:  PCP.   reports that she has never smoked. She has never used smokeless tobacco. She reports current alcohol use. She reports that she does not use drugs.  Past Medical History:  Diagnosis Date   Asthma    Contact lens/glasses fitting    contact lt eye   Diabetes mellitus without complication (HCC)    Genital warts    Hypercholesteremia    Hypertension    Increased pressure in the eye    not diagnosed with glaucoma yet   Leg pain     Past Surgical History:  Procedure Laterality Date   BREAST LUMPECTOMY WITH NEEDLE LOCALIZATION Left 07/05/2013   Procedure: LEFT BREAST LUMPECTOMY WITH NEEDLE LOCALIZATION;  Surgeon: Shelly Rubenstein, MD;  Location: [facility];  Service: General;  Laterality: Left;   BREAST SURGERY     cataract surgery  2009   right eye   COLPOSCOPY     HAMMER TOE SURGERY  2004   bilateral   ORIF WRIST FRACTURE  6/14   gsc   TONSILLECTOMY      Current Outpatient Medications  Medication Sig Dispense Refill   amLODipine (NORVASC) 5 MG tablet Take 5 mg by mouth daily.     aspirin EC 81  MG tablet Take 81 mg by mouth daily.     atorvastatin (LIPITOR) 20 MG tablet Take 20 mg by mouth daily.     dorzolamide-timolol (COSOPT) 22.3-6.8 MG/ML ophthalmic solution Place 1 drop into both eyes daily.     losartan-hydrochlorothiazide (HYZAAR) 100-25 MG tablet Take 1 tablet by mouth daily.     metFORMIN (GLUCOPHAGE) 1000 MG tablet Take 1,000 mg by mouth 2 (two) times daily with a meal.     No current facility-administered medications for this visit.    Family History  Problem Relation Age of Onset   Breast cancer Mother 30   Diabetes Mother    Hypertension Mother    Breast cancer Maternal Aunt 38   Hypertension Maternal Grandmother    Glaucoma Maternal Grandmother     Review of Systems  All other systems reviewed and are negative.   Exam:   BP 136/80 (BP Location: Left Arm, Patient Position: Sitting, Cuff Size: Normal)   Pulse 71   Ht 5\' 4"  (1.626 m)   Wt 177 lb (80.3 kg)   SpO2 96%   BMI 30.38 kg/m     General appearance: alert, cooperative and appears stated  age Head: normocephalic, without obvious abnormality, atraumatic Neck: no adenopathy, supple, symmetrical, trachea midline and thyroid normal to inspection and palpation Lungs: clear to auscultation bilaterally Breasts: normal appearance, no masses or tenderness, No nipple retraction or dimpling, No nipple discharge or bleeding, No axillary adenopathy Heart: regular rate and rhythm Abdomen: soft, non-tender; no masses, no organomegaly Extremities: extremities normal, atraumatic, no cyanosis or edema Skin: skin color, texture, turgor normal. No rashes or lesions Lymph nodes: cervical, supraclavicular, and axillary nodes normal. Neurologic: grossly normal  Pelvic: External genitalia:  no lesions              No abnormal inguinal nodes palpated.              Urethra:  normal appearing urethra with no masses, tenderness or lesions              Bartholins and Skenes: normal                 Vagina: normal appearing  vagina with normal color and discharge, left vaginal wall with 7 - 8 mm polyp-like growth.               Cervix: no lesions              Pap taken: yes Bimanual Exam:  Uterus:  normal size, contour, position, consistency, mobility, non-tender              Adnexa: no mass, fullness, tenderness              Rectal exam: yes.  Confirms.              Anus:  normal sphincter tone, no lesions  Chaperone was present for exam:  Warren Lacy, CMA  Assessment:   Well woman visit with gynecologic exam. Remote history of abnormal pap. Vaginal lesion.  FH breast cancer - mother and maternal aunt, postmenopausal.  Mother negative for BRCA.   Plan: Mammogram screening discussed.  Will get copy of mammogram. Self breast awareness reviewed. Pap and HR HPV collected. Guidelines for Calcium, Vitamin D, regular exercise program including cardiovascular and weight bearing exercise. BMD next year at [facility] when she does her mammogram.  Return for colposcopy and vaginal biopsy in 2 - 3 weeks.  We discussed genetic counseling and testing if desired.  She will consider this for the future.  We discussed potential yearly breast MRI for breast cancer screening if her personal risk of breast cancer is elevated. Follow up annually and prn.

## 2023-03-31 ENCOUNTER — Other Ambulatory Visit (HOSPITAL_COMMUNITY)
Admission: RE | Admit: 2023-03-31 | Discharge: 2023-03-31 | Disposition: A | Discharge status: Home / Self Care | Payer: Federal, State, Local not specified - PPO | Source: Ambulatory Visit | Attending: Obstetrics and Gynecology | Admitting: Obstetrics and Gynecology

## 2023-03-31 ENCOUNTER — Ambulatory Visit: Payer: Federal, State, Local not specified - PPO | Admitting: Obstetrics and Gynecology

## 2023-03-31 ENCOUNTER — Encounter: Payer: Self-pay | Admitting: Obstetrics and Gynecology

## 2023-03-31 VITALS — BP 136/80 | HR 71 | Ht 64.0 in | Wt 177.0 lb

## 2023-03-31 DIAGNOSIS — Z01419 Encounter for gynecological examination (general) (routine) without abnormal findings: Secondary | ICD-10-CM | POA: Diagnosis not present

## 2023-03-31 DIAGNOSIS — N898 Other specified noninflammatory disorders of vagina: Secondary | ICD-10-CM | POA: Diagnosis not present

## 2023-03-31 DIAGNOSIS — Z124 Encounter for screening for malignant neoplasm of cervix: Secondary | ICD-10-CM

## 2023-03-31 NOTE — Patient Instructions (Signed)

## 2023-04-01 LAB — CYTOLOGY - PAP
Comment: NEGATIVE
Diagnosis: NEGATIVE
High risk HPV: NEGATIVE

## 2023-04-21 DIAGNOSIS — E7800 Pure hypercholesterolemia, unspecified: Secondary | ICD-10-CM | POA: Diagnosis not present

## 2023-04-21 DIAGNOSIS — E1169 Type 2 diabetes mellitus with other specified complication: Secondary | ICD-10-CM | POA: Diagnosis not present

## 2023-04-21 DIAGNOSIS — I10 Essential (primary) hypertension: Secondary | ICD-10-CM | POA: Diagnosis not present

## 2023-04-21 DIAGNOSIS — R002 Palpitations: Secondary | ICD-10-CM | POA: Diagnosis not present

## 2023-04-21 NOTE — Progress Notes (Signed)
GYNECOLOGY  VISIT   HPI: 62 y.o.   Married  [place] American  female   [phone] with No LMP recorded. Patient is postmenopausal.   here for colposcopy for vaginal lesion noted at annual exam.   Her pap was normal and her high risk HPV test was negative on 03/31/23.   GYNECOLOGIC HISTORY: No LMP recorded. Patient is postmenopausal. Contraception:  PMP Menopausal hormone therapy:  n/a Last mammogram:  02/09/23 Breast Density Cat A, BI-RADS CAT 1 neg Last pap smear:   03/31/23 neg: HR HPV neg, 02/05/21 neg: HR HPV neg        OB History     Gravida  2   Para  2   Term  2   Preterm      AB      Living  2      SAB      IAB      Ectopic      Multiple      Live Births  2              Patient Active Problem List   Diagnosis Date Noted   Constipation 02/10/2022   Family history of colonic polyps 02/10/2022   Obesity 02/10/2022   Low back pain 02/10/2022   Increased pressure in the eye    Recurrent cold sores 03/10/2016   Type 2 diabetes mellitus with complication (HCC) 01/20/2015   Hyperlipidemia 01/20/2015   Essential hypertension 01/20/2015   Intraductal papilloma of left breast 06/26/2013   Benign neoplasm of breast 06/26/2013   Spider veins 08/31/2012   Pain in limb 07/26/2012   Venous insufficiency 07/26/2012    Past Medical History:  Diagnosis Date   Asthma    Contact lens/glasses fitting    contact lt eye   Diabetes mellitus without complication (HCC)    Genital warts    Hypercholesteremia    Hypertension    Increased pressure in the eye    not diagnosed with glaucoma yet   Leg pain     Past Surgical History:  Procedure Laterality Date   BREAST LUMPECTOMY WITH NEEDLE LOCALIZATION Left 07/05/2013   Procedure: LEFT BREAST LUMPECTOMY WITH NEEDLE LOCALIZATION;  Surgeon: Shelly Rubenstein, MD;  Location: [facility];  Service: General;  Laterality: Left;   BREAST SURGERY     cataract surgery  2009   right eye   COLPOSCOPY      HAMMER TOE SURGERY  2004   bilateral   ORIF WRIST FRACTURE  6/14   gsc   TONSILLECTOMY      Current Outpatient Medications  Medication Sig Dispense Refill   amLODipine (NORVASC) 5 MG tablet Take 5 mg by mouth daily.     aspirin EC 81 MG tablet Take 81 mg by mouth daily.     atorvastatin (LIPITOR) 20 MG tablet Take 20 mg by mouth daily.     dorzolamide-timolol (COSOPT) 22.3-6.8 MG/ML ophthalmic solution Place 1 drop into both eyes daily.     losartan-hydrochlorothiazide (HYZAAR) 100-25 MG tablet Take 1 tablet by mouth daily.     metFORMIN (GLUCOPHAGE) 1000 MG tablet Take 1,000 mg by mouth 2 (two) times daily with a meal.     No current facility-administered medications for this visit.     ALLERGIES: Codeine  Family History  Problem Relation Age of Onset   Breast cancer Mother 76   Diabetes Mother    Hypertension Mother    Breast cancer Maternal Aunt 33   Hypertension  Maternal Grandmother    Glaucoma Maternal Grandmother     Social History   Socioeconomic History   Marital status: Married    Spouse name: Not on file   Number of children: Not on file   Years of education: Not on file   Highest education level: Not on file  Occupational History   Not on file  Tobacco Use   Smoking status: Never   Smokeless tobacco: Never  Vaping Use   Vaping status: Never Used  Substance and Sexual Activity   Alcohol use: Yes    Comment: social drinker   Drug use: No   Sexual activity: Yes    Partners: Male    Birth control/protection: Post-menopausal  Other Topics Concern   Not on file  Social History Narrative   Not on file   Social Determinants of Health   Financial Resource Strain: Not on file  Food Insecurity: Not on file  Transportation Needs: Not on file  Physical Activity: Not on file  Stress: Not on file  Social Connections: Not on file  Intimate Partner Violence: Not on file    Review of Systems  All other systems reviewed and are negative.   PHYSICAL  EXAMINATION:    BP 130/82   Pulse 76   Ht 5\' 4"  (1.626 m)   Wt 178 lb (80.7 kg)   BMI 30.55 kg/m     General appearance: alert, cooperative and appears stated age   Colposcopy - cervix, vagina. Consent for procedure.  3% acetic acid used in vagina and on vulva. White light and green light filter used.  Colposcopy satisfactory:  Yes   _____          No    __x___ Findings:    Cervix:  atrophy of cervix and vagina Vagina:  7 x 7 mm left vaginal wall polypoid lesion.  Biopsy:  left vaginal polyp. Silver nitrate used and Monsel's placed.  Minimal EBL. No complications.   Chaperone was present for exam:  Antony Salmon, CMA  ASSESSMENT  Vaginal lesion.  I suspect this is a polyp.  Vaginal atrophy.    PLAN  Post colpo instructions given.  FU biopsy.  Atrophy discussed.  No Rx given.  Final plan to follow.

## 2023-04-25 DIAGNOSIS — L03031 Cellulitis of right toe: Secondary | ICD-10-CM | POA: Diagnosis not present

## 2023-05-05 ENCOUNTER — Ambulatory Visit (INDEPENDENT_AMBULATORY_CARE_PROVIDER_SITE_OTHER): Payer: Federal, State, Local not specified - PPO | Admitting: Obstetrics and Gynecology

## 2023-05-05 ENCOUNTER — Other Ambulatory Visit (HOSPITAL_COMMUNITY)
Admission: RE | Admit: 2023-05-05 | Discharge: 2023-05-05 | Disposition: A | Discharge status: Home / Self Care | Payer: Federal, State, Local not specified - PPO | Source: Ambulatory Visit | Attending: Obstetrics and Gynecology | Admitting: Obstetrics and Gynecology

## 2023-05-05 VITALS — BP 130/82 | HR 76 | Ht 64.0 in | Wt 178.0 lb

## 2023-05-05 DIAGNOSIS — N898 Other specified noninflammatory disorders of vagina: Secondary | ICD-10-CM | POA: Diagnosis not present

## 2023-05-05 DIAGNOSIS — N841 Polyp of cervix uteri: Secondary | ICD-10-CM | POA: Diagnosis not present

## 2023-05-05 DIAGNOSIS — N952 Postmenopausal atrophic vaginitis: Secondary | ICD-10-CM

## 2023-05-05 DIAGNOSIS — L859 Epidermal thickening, unspecified: Secondary | ICD-10-CM | POA: Diagnosis not present

## 2023-05-05 NOTE — Patient Instructions (Signed)
Colposcopy, Care After  The following information offers guidance on how to care for yourself after your procedure. Your health care provider may also give you more specific instructions. If you have problems or questions, contact your health care provider. What can I expect after the procedure? If you had a colposcopy without a biopsy, you can expect to feel fine right away after your procedure. However, you may have some spotting of blood for a few days. You can return to your normal activities. If you had a colposcopy with a biopsy, it is common after the procedure to have: Soreness and mild pain. These may last for a few days. Mild vaginal bleeding or discharge that is dark-colored and grainy. This may last for a few days. The discharge may be caused by a liquid (solution) that was used during the procedure. You may need to wear a sanitary pad during this time. Spotting of blood for at least 48 hours after the procedure. Follow these instructions at home: Medicines Take over-the-counter and prescription medicines only as told by your health care provider. Talk with your health care provider about what type of over-the-counter pain medicines and prescription medicines you can start to take again. It is especially important to talk with your health care provider if you take blood thinners. Activity Avoid using douche products, using tampons, and having sex for at least 3 days after the procedure or for as long as told by your health care provider. Return to your normal activities as told by your health care provider. Ask your health care provider what activities are safe for you. General instructions Ask your health care provider if you may take baths, swim, or use a hot tub. You may take showers. If you use birth control (contraception), continue to use it. Keep all follow-up visits. This is important. Contact a health care provider if: You have a fever or chills. You faint or feel  light-headed. Get help right away if: You have heavy bleeding from your vagina or pass blood clots. Heavy bleeding is bleeding that soaks through a sanitary pad in less than 1 hour. You have vaginal discharge that is abnormal, is yellow in color, or smells bad. This could be a sign of infection. You have severe pain or cramps in your lower abdomen that do not go away with medicine. Summary If you had a colposcopy without a biopsy, you can expect to feel fine right away, but you may have some spotting of blood for a few days. You can return to your normal activities. If you had a colposcopy with a biopsy, it is common to have mild pain for a few days and spotting for 48 hours after the procedure. Avoid using douche products, using tampons, and having sex for at least 3 days after the procedure or for as long as told by your health care provider. Get help right away if you have heavy bleeding, severe pain, or signs of infection. This information is not intended to replace advice given to you by your health care provider. Make sure you discuss any questions you have with your health care provider. Document Revised: 11/02/2020 Document Reviewed: 11/02/2020 Elsevier Patient Education  2024 Elsevier Inc.  

## 2023-05-06 ENCOUNTER — Encounter: Payer: Self-pay | Admitting: Obstetrics and Gynecology

## 2023-05-09 DIAGNOSIS — M792 Neuralgia and neuritis, unspecified: Secondary | ICD-10-CM | POA: Diagnosis not present

## 2023-05-09 LAB — SURGICAL PATHOLOGY

## 2023-05-12 DIAGNOSIS — R002 Palpitations: Secondary | ICD-10-CM | POA: Diagnosis not present

## 2023-06-08 DIAGNOSIS — M792 Neuralgia and neuritis, unspecified: Secondary | ICD-10-CM | POA: Diagnosis not present

## 2023-06-29 DIAGNOSIS — H401131 Primary open-angle glaucoma, bilateral, mild stage: Secondary | ICD-10-CM | POA: Diagnosis not present

## 2023-07-27 ENCOUNTER — Encounter: Payer: Self-pay | Admitting: *Deleted

## 2023-07-27 ENCOUNTER — Encounter: Payer: Self-pay | Admitting: Cardiology

## 2023-07-27 ENCOUNTER — Ambulatory Visit: Attending: Cardiology | Admitting: Cardiology

## 2023-07-27 VITALS — BP 156/72 | HR 82 | Resp 16 | Ht 64.0 in | Wt 184.2 lb

## 2023-07-27 DIAGNOSIS — R0609 Other forms of dyspnea: Secondary | ICD-10-CM | POA: Diagnosis not present

## 2023-07-27 DIAGNOSIS — R002 Palpitations: Secondary | ICD-10-CM | POA: Diagnosis not present

## 2023-07-27 DIAGNOSIS — I493 Ventricular premature depolarization: Secondary | ICD-10-CM

## 2023-07-27 DIAGNOSIS — E1165 Type 2 diabetes mellitus with hyperglycemia: Secondary | ICD-10-CM

## 2023-07-27 DIAGNOSIS — I10 Essential (primary) hypertension: Secondary | ICD-10-CM

## 2023-07-27 DIAGNOSIS — E7800 Pure hypercholesterolemia, unspecified: Secondary | ICD-10-CM

## 2023-07-27 MED ORDER — ATORVASTATIN CALCIUM 40 MG PO TABS: 40.0000 mg | ORAL_TABLET | Freq: Every day | ORAL | 0 refills | Status: AC

## 2023-07-27 NOTE — Patient Instructions (Addendum)
 Medication Instructions:  Your physician has recommended you make the following change in your medication: Increase Atorvastatin  to 40 mg by mouth daily   *If you need a refill on your cardiac medications before your next appointment, please call your pharmacy*   Lab Work: Have fasting lab work checked in 2 months prior to appointment. Lipid profile.  Can be done at any [facility].  There is an office on the first floor of our building.  If you have labs (blood work) drawn today and your tests are completely normal, you will receive your results only by: MyChart Message (if you have MyChart) OR A paper copy in the mail If you have any lab test that is abnormal or we need to change your treatment, we will call you to review the results.   Testing/Procedures: Your physician has requested that you have an exercise stress myoview . For further information please visit [email or web address] Please follow instruction sheet, as given.   Your physician has requested that you have an echocardiogram. Echocardiography is a painless test that uses sound waves to create images of your heart. It provides your doctor with information about the size and shape of your heart and how well your heart's chambers and valves are working. This procedure takes approximately one hour. There are no restrictions for this procedure. Please do NOT wear cologne, perfume, aftershave, or lotions (deodorant is allowed). Please arrive 15 minutes prior to your appointment time.  Please note: We ask at that you not bring children with you during ultrasound (echo/ vascular) testing. Due to room size and safety concerns, children are not allowed in the ultrasound rooms during exams. Our front office staff cannot provide observation of children in our lobby area while testing is being conducted. An adult accompanying a patient to their appointment will only be allowed in the ultrasound room at the discretion of the ultrasound technician  under special circumstances. We apologize for any inconvenience.    Follow-Up: At [facility], you and your health needs are our priority.  As part of our continuing mission to provide you with exceptional heart care, we have created designated Provider Care Teams.  These Care Teams include your primary Cardiologist (physician) and Advanced Practice Providers (APPs -  Physician Assistants and Nurse Practitioners) who all work together to provide you with the care you need, when you need it.  We recommend signing up for the patient portal called MyChart.  Sign up information is provided on this After Visit Summary.  MyChart is used to connect with patients for Virtual Visits (Telemedicine).  Patients are able to view lab/test results, encounter notes, upcoming appointments, etc.  Non-urgent messages can be sent to your provider as well.   To learn more about what you can do with MyChart, go to [email or web address].    Your next appointment:   2 month(s)  Provider:   Gordy Bergamo, MD or APP    Other Instructions

## 2023-07-27 NOTE — Progress Notes (Signed)
 Cardiology Office Note:  .   Date:  07/27/2023  ID:  Brittney Gibson, DOB [birth year], MRN [id number] PCP: Brittney Velna SAUNDERS, MD  [facility] Providers Cardiologist:  Brittney Bergamo, MD   History of Present Illness: Brittney   [name] is a 62 y.o. African-American female patient with hypertension, hypercholesterolemia, diabetes mellitus, mild obesity referred to me for evaluation of palpitations and abnormal extended outpatient EKG monitoring.  Except for palpitations and mild dyspnea on exertion denies chest pain, dizziness or syncope.  There is no family history of sudden cardiac death.  She is a non-smoker and consumes alcohol very rarely.  Her only physical activity has been taking care of her grandkids but endorses mild dyspnea activities.  She has been experiencing heart racing symptoms since early October 2024, which she associates with receiving a COVID booster on March 21, 2023. The sensation is described as her heart racing on and off, accompanied by tiredness and occasional tightness, similar to the feeling before a big event or roller coaster ride. These episodes occur frequently, especially 10 to 15 minutes after eating, and sometimes when lying down. The symptoms have remained consistent over time. No episodes of syncope have been noted.  Discussed the use of AI scribe software for clinical note transcription with the patient, who gave verbal consent to proceed.  Labs   Lab Results  Component Value Date   NA 144 07/04/2013   K 3.6 (L) 07/04/2013   CO2 29 07/04/2013   GLUCOSE 127 (H) 07/04/2013   BUN 15 07/04/2013   CREATININE 0.76 07/04/2013   CALCIUM  9.7 07/04/2013   GFRNONAA >90 07/04/2013      Latest Ref Rng & Units 07/04/2013    2:20 PM 11/24/2012    8:59 PM  BMP  Glucose 70 - 99 mg/dL 872  827   BUN 6 - 23 mg/dL 15  17   Creatinine 9.49 - 1.10 mg/dL 9.23  9.19   Sodium 862 - 147 mEq/L 144  140   Potassium 3.7 - 5.3 mEq/L 3.6  3.4   Chloride 96 - 112 mEq/L  102  102   CO2 19 - 32 mEq/L 29  29   Calcium  8.4 - 10.5 mg/dL 9.7  9.8       Latest Ref Rng & Units 11/24/2012    8:59 PM  CBC  WBC 4.0 - 10.5 K/uL 8.9   Hemoglobin 12.0 - 15.0 g/dL 87.1   Hematocrit 63.9 - 46.0 % 37.7   Platelets 150 - 400 K/uL 338    External Labs:  PCP labs 04/21/2023:  Hb 11.9/HCT 35.1, platelets 300 68K, normal indicis.  Serum glucose 87 mg, BUN 19, creatinine 0.83, EGFR 78 mL, potassium 3.7.  Magnesium 1.7.  Labs 02/23/2023:  Total cholesterol 175, triglycerides 111, HDL 54, LDL 101.  Labs 08/26/2022:  A1c 7.8%.  Review of Systems  Cardiovascular:  Positive for palpitations. Negative for chest pain, dyspnea on exertion and leg swelling.    Physical Exam:   VS:  BP (!) 156/72 (BP Location: Left Arm, Patient Position: Sitting, Cuff Size: Normal)   Pulse 82   Resp 16   Ht 5' 4 (1.626 m)   Wt 184 lb 3.2 oz (83.6 kg)   SpO2 96%   BMI 31.62 kg/m    Wt Readings from Last 3 Encounters:  07/27/23 184 lb 3.2 oz (83.6 kg)  05/05/23 178 lb (80.7 kg)  03/31/23 177 lb (80.3 kg)    Physical Exam Constitutional:  Appearance: She is obese.  Neck:     Vascular: No carotid bruit or JVD.  Cardiovascular:     Rate and Rhythm: Normal rate and regular rhythm. Occasional Extrasystoles are present.    Pulses: Intact distal pulses.     Heart sounds: Normal heart sounds. No murmur heard.    No gallop.  Pulmonary:     Effort: Pulmonary effort is normal.     Breath sounds: Normal breath sounds.  Abdominal:     General: Bowel sounds are normal.     Palpations: Abdomen is soft.  Musculoskeletal:     Right lower leg: No edema.     Left lower leg: No edema.    Studies Reviewed: Brittney    PCP performed Zio patch extended EKG monitoring 13 days starting 04/21/2023 (personally reviewed): Predominant underlying rhythm was normal sinus rhythm.  Minimum heart rate 56 bpm at 11:13 AM and maximum heart rate 108 bpm at 1:39 PM. First-degree AV block was present.   Isolated PACs and rare atrial triplets were present. Isolated PVCs were frequent (31%).  There were no ventricular ectopics or triplets.  Ventricular bigeminy and trigeminy was present. Patient activated events correlated with PVCs, ventricular bigeminy and trigeminy.  EKG:    EKG Interpretation Date/Time:  Wednesday July 27 2023 14:00:22 EST Ventricular Rate:  75 PR Interval:  180 QRS Duration:  86 QT Interval:  386 QTC Calculation: 431 R Axis:   0  Text Interpretation: EKG 05/25/2024: Normal sinus rhythm at a rate of 75 bpm, LAE, normal axis, frequent PVCs (4) in bigeminal pattern.  No evidence of ischemia, normal QT interval.  Compared to PCP EKG from 04/21/2023, no significant change, previously 2 PVCs were noted. Confirmed by Brittney Gibson, Brittney Gibson [phone]) on 07/27/2023 2:04:38 PM    PCP EKG 04/21/2023: Normal sinus rhythm at rate of 77 bpm, LAE, normal axis, PVCs (2).  Medications and allergies    Allergies  Allergen Reactions   Codeine Anaphylaxis     Current Outpatient Medications:    amLODipine (NORVASC) 5 MG tablet, Take 10 mg by mouth daily., Disp: , Rfl:    aspirin EC 81 MG tablet, Take 81 mg by mouth daily., Disp: , Rfl:    dorzolamide (TRUSOPT) 2 % ophthalmic solution, Place 1 drop into both eyes 2 (two) times daily., Disp: , Rfl:    dorzolamide-timolol (COSOPT) 22.3-6.8 MG/ML ophthalmic solution, Place 1 drop into both eyes daily., Disp: , Rfl:    losartan-hydrochlorothiazide (HYZAAR) 100-25 MG tablet, Take 1 tablet by mouth daily., Disp: , Rfl:    metFORMIN (GLUCOPHAGE) 1000 MG tablet, Take 1,000 mg by mouth 2 (two) times daily with a meal., Disp: , Rfl:    Multiple Vitamins-Minerals (CENTRUM SILVER 50+WOMEN PO), Take by mouth., Disp: , Rfl:    polyethylene glycol (MIRALAX / GLYCOLAX) 17 g packet, Take 17 g by mouth daily., Disp: , Rfl:    atorvastatin  (LIPITOR) 40 MG tablet, Take 1 tablet (40 mg total) by mouth daily., Disp: 90 tablet, Rfl: 0   ASSESSMENT AND PLAN: .       ICD-10-CM   1. Palpitations  R00.2 ECHOCARDIOGRAM COMPLETE    MYOCARDIAL PERFUSION IMAGING    2. DOE (dyspnea on exertion)  R06.09 ECHOCARDIOGRAM COMPLETE    MYOCARDIAL PERFUSION IMAGING    Cardiac Stress Test: Informed Consent Details: Physician/Practitioner Attestation; Transcribe to consent form and obtain patient signature    3. Frequent unifocal PVCs  I49.3 EKG 12-Lead    ECHOCARDIOGRAM COMPLETE    MYOCARDIAL  PERFUSION IMAGING    Cardiac Stress Test: Informed Consent Details: Physician/Practitioner Attestation; Transcribe to consent form and obtain patient signature    4. Primary hypertension  I10     5. Type 2 diabetes mellitus with hyperglycemia, without long-term current use of insulin (HCC)  E11.65 Cardiac Stress Test: Informed Consent Details: Physician/Practitioner Attestation; Transcribe to consent form and obtain patient signature    6. Hypercholesteremia  E78.00 atorvastatin  (LIPITOR) 40 MG tablet    Lipid Profile     Assessment and Plan    Palpitations Intermittent palpitations since October, possibly related to PVCs. Symptoms include heart racing, tightness, and episodes occurring postprandially and at rest. Differential includes benign PVCs versus underlying cardiac pathology Including CAD.  Discussed the need for an echocardiogram and stress test especially in view of uncontrolled hypertension, hypercholesterolemia, diabetes mellitus, obesity and her age.  I am also concerned about her PVC burden of greater than 25% that may contribute to potential for cardiomyopathy hence need for LVEF evaluation as well. - Order echocardiogram - Order stress test  Hypertension Chronic hypertension with current readings around 140/90 mmHg you are at home. Blood pressure not well controlled. Current medication is amlodipine 10 mg. Discussed the importance of maintaining blood pressure at 130/80 mmHg or less to reduce cardiovascular risk. Patient prefers to focus on lifestyle  modifications before adding more medications. - Continue amlodipine 10 mg - Monitor blood pressure at home  Hyperlipidemia Hyperlipidemia with LDL of 101 mg/dL. Current medication is atorvastatin  20 mg. Explained that LDL should be 70 mg/dL or less for diabetic patients to reduce cardiovascular risk. Discussed increasing atorvastatin  to 40 mg daily and rechecking lipid panel in 2 months. If LDL remains high, may need to add Zetia. - Increase atorvastatin  to 40 mg daily - Recheck lipid panel in 2 months  Type 2 Diabetes Mellitus Type 2 diabetes with suboptimal control. Last A1c was 7.8%. Current medication is metformin. Discussed the benefits of adding Jardiance or Farxiga to improve glycemic control, aid in weight loss, and provide cardiovascular and renal protection. Patient prefers to focus on lifestyle changes before adding new medications. - Recommend discussing addition of Jardiance or Farxiga with primary care physician - Monitor blood glucose levels  General Health Maintenance Advised to make lifestyle modifications including increased physical activity and dietary changes to manage weight and improve overall health. Discussed the negative impact of regular soda intake on diabetes and overall health. - Encourage regular physical activity - Advise reducing soda intake - Monitor dietary habits  Follow-up - Schedule follow-up appointment in 2 months - Order lipid panel 3-4 days before follow-up appointment.  -Weight loss discussed Signed,  Brittney Bergamo, MD, [facility] 07/27/2023, 9:07 PM [facility] [address] [place], [place] [address] Phone: [phone]. Fax:  [phone]

## 2023-08-22 ENCOUNTER — Telehealth (HOSPITAL_COMMUNITY): Payer: Self-pay

## 2023-08-22 NOTE — Telephone Encounter (Signed)
 No correct numbers

## 2023-08-23 ENCOUNTER — Ambulatory Visit (HOSPITAL_COMMUNITY): Attending: Cardiology

## 2023-08-23 ENCOUNTER — Encounter: Payer: Self-pay | Admitting: Cardiology

## 2023-08-23 ENCOUNTER — Ambulatory Visit (HOSPITAL_BASED_OUTPATIENT_CLINIC_OR_DEPARTMENT_OTHER)

## 2023-08-23 DIAGNOSIS — R002 Palpitations: Secondary | ICD-10-CM | POA: Insufficient documentation

## 2023-08-23 DIAGNOSIS — I5030 Unspecified diastolic (congestive) heart failure: Secondary | ICD-10-CM

## 2023-08-23 DIAGNOSIS — I493 Ventricular premature depolarization: Secondary | ICD-10-CM | POA: Insufficient documentation

## 2023-08-23 DIAGNOSIS — R0609 Other forms of dyspnea: Secondary | ICD-10-CM | POA: Diagnosis not present

## 2023-08-23 LAB — ECHOCARDIOGRAM COMPLETE
Area-P 1/2: 3.27 cm2
Height: 64 in
S' Lateral: 3.2 cm
Weight: 2944 [oz_av]

## 2023-08-23 LAB — MYOCARDIAL PERFUSION IMAGING
Angina Index: 0
Duke Treadmill Score: 6
Estimated workload: 7
Exercise duration (min): 6 min
Exercise duration (sec): 0 s
LV dias vol: 54 mL (ref 46–106)
LV sys vol: 27 mL
MPHR: 156 {beats}/min
Nuc Stress EF: 50 %
Peak HR: 151 {beats}/min
Percent HR: 96 %
Rest HR: 81 {beats}/min
Rest Nuclear Isotope Dose: 10.5 mCi
SDS: 1
SRS: 0
SSS: 1
ST Depression (mm): 0 mm
Stress Nuclear Isotope Dose: 30.5 mCi
TID: 1

## 2023-08-23 MED ORDER — TECHNETIUM TC 99M TETROFOSMIN IV KIT
30.5000 | PACK | Freq: Once | INTRAVENOUS | Status: AC | PRN
  Administered 2023-08-23: 30.5 via INTRAVENOUS

## 2023-08-23 MED ORDER — TECHNETIUM TC 99M TETROFOSMIN IV KIT
10.5000 | PACK | Freq: Once | INTRAVENOUS | Status: AC | PRN
  Administered 2023-08-23: 10.5 via INTRAVENOUS

## 2023-08-23 NOTE — Progress Notes (Signed)
 Normal echocardiogram

## 2023-08-23 NOTE — Progress Notes (Signed)
 Hypertensive, low normal workload, frequent PVCs at rest and in recovery, at peak exercise PVCs decreased in frequency.  Normal perfusion suggest no evidence of any significant coronary artery disease.  Will discuss more at her office visit.

## 2023-08-24 ENCOUNTER — Ambulatory Visit (HOSPITAL_COMMUNITY)

## 2023-09-07 DIAGNOSIS — I10 Essential (primary) hypertension: Secondary | ICD-10-CM | POA: Diagnosis not present

## 2023-09-07 DIAGNOSIS — I493 Ventricular premature depolarization: Secondary | ICD-10-CM | POA: Diagnosis not present

## 2023-09-07 DIAGNOSIS — E1169 Type 2 diabetes mellitus with other specified complication: Secondary | ICD-10-CM | POA: Diagnosis not present

## 2023-09-07 DIAGNOSIS — E7800 Pure hypercholesterolemia, unspecified: Secondary | ICD-10-CM | POA: Diagnosis not present

## 2023-09-07 DIAGNOSIS — Z0000 Encounter for general adult medical examination without abnormal findings: Secondary | ICD-10-CM | POA: Diagnosis not present

## 2023-09-29 ENCOUNTER — Telehealth: Payer: Self-pay | Admitting: Cardiology

## 2023-09-29 NOTE — Telephone Encounter (Signed)
 Called patient back about message. Patient stated Dr. Jacinto Halim wanted her to have lipid panel before she came to her next visit. Patient stated her PCP just did lab work in March. Informed patient that we do not have that information as of today. Patient will call her PCP office to see if they can get it sent to [place] by fax. Advised patient to bring in a copy to lab work at her next visit and we can scan that into her chart. Patient verbalized understanding.

## 2023-09-29 NOTE — Telephone Encounter (Signed)
 New Message:       Patient says she has an appointment on 10-04-23. She says she needs to ask some questions before that appointment please.

## 2023-09-30 LAB — LIPID PANEL
Chol/HDL Ratio: 2.8 ratio (ref 0.0–4.4)
Cholesterol, Total: 144 mg/dL (ref 100–199)
HDL: 52 mg/dL (ref >39)
LDL Chol Calc (NIH): 77 mg/dL (ref 0–99)
Triglycerides: 78 mg/dL (ref 0–149)
VLDL Cholesterol Cal: 15 mg/dL (ref 5–40)

## 2023-10-01 ENCOUNTER — Encounter: Payer: Self-pay | Admitting: Cardiology

## 2023-10-01 NOTE — Progress Notes (Signed)
 LDL is not at target in view of diabetes mellitus, she has a follow-up appointment with me and will discuss.

## 2023-10-04 ENCOUNTER — Ambulatory Visit: Attending: Cardiology | Admitting: Cardiology

## 2023-10-04 ENCOUNTER — Encounter: Payer: Self-pay | Admitting: Cardiology

## 2023-10-04 VITALS — BP 131/70 | HR 83 | Resp 16 | Ht 64.0 in | Wt 180.2 lb

## 2023-10-04 DIAGNOSIS — R002 Palpitations: Secondary | ICD-10-CM | POA: Diagnosis not present

## 2023-10-04 DIAGNOSIS — I493 Ventricular premature depolarization: Secondary | ICD-10-CM

## 2023-10-04 DIAGNOSIS — I10 Essential (primary) hypertension: Secondary | ICD-10-CM

## 2023-10-04 DIAGNOSIS — R9431 Abnormal electrocardiogram [ECG] [EKG]: Secondary | ICD-10-CM

## 2023-10-04 DIAGNOSIS — E7800 Pure hypercholesterolemia, unspecified: Secondary | ICD-10-CM

## 2023-10-04 NOTE — Patient Instructions (Addendum)
 Medication Instructions:  Your physician has recommended you make the following change in your medication: Stop aspirin  *If you need a refill on your cardiac medications before your next appointment, please call your pharmacy*  Lab Work: none If you have labs (blood work) drawn today and your tests are completely normal, you will receive your results only by: MyChart Message (if you have MyChart) OR A paper copy in the mail If you have any lab test that is abnormal or we need to change your treatment, we will call you to review the results.  Testing/Procedures: Your physician has requested that you have an echocardiogram in one year. Echocardiography is a painless test that uses sound waves to create images of your heart. It provides your doctor with information about the size and shape of your heart and how well your heart's chambers and valves are working. This procedure takes approximately one hour. There are no restrictions for this procedure. Please do NOT wear cologne, perfume, aftershave, or lotions (deodorant is allowed). Please arrive 15 minutes prior to your appointment time.  Please note: We ask at that you not bring children with you during ultrasound (echo/ vascular) testing. Due to room size and safety concerns, children are not allowed in the ultrasound rooms during exams. Our front office staff cannot provide observation of children in our lobby area while testing is being conducted. An adult accompanying a patient to their appointment will only be allowed in the ultrasound room at the discretion of the ultrasound technician under special circumstances. We apologize for any inconvenience.   Follow-Up: At [facility], you and your health needs are our priority.  As part of our continuing mission to provide you with exceptional heart care, our providers are all part of one team.  This team includes your primary Cardiologist (physician) and Advanced Practice Providers or  APPs (Physician Assistants and Nurse Practitioners) who all work together to provide you with the care you need, when you need it.  Your next appointment:   12 month(s)  Provider:   Knox Perl, MD     We recommend signing up for the patient portal called "MyChart".  Sign up information is provided on this After Visit Summary.  MyChart is used to connect with patients for Virtual Visits (Telemedicine).  Patients are able to view lab/test results, encounter notes, upcoming appointments, etc.  Non-urgent messages can be sent to your provider as well.   To learn more about what you can do with MyChart, go to [email or web address].   Other Instructions     1st Floor: - Lobby - Registration  - Pharmacy  - Lab - Cafe  2nd Floor: - PV Lab - Diagnostic Testing (echo, CT, nuclear med)  3rd Floor: - Vacant  4th Floor: - TCTS (cardiothoracic surgery) - AFib Clinic - Structural Heart Clinic - Vascular Surgery  - Vascular Ultrasound  5th Floor: - HeartCare Cardiology (general and EP) - Clinical Pharmacy for coumadin, hypertension, lipid, weight-loss medications, and med management appointments    Valet parking services will be available as well.

## 2023-10-04 NOTE — Progress Notes (Signed)
 Cardiology Office Note:  .   Date:  10/04/2023  ID:  Brittney Gibson, DOB [birth year], MRN [id number] PCP: Ollen Bowl, MD  [facility] Providers Cardiologist:  Yates Decamp, MD   History of Present Illness: .    Discussed the use of AI scribe software for clinical note transcription with the patient, who gave verbal consent to proceed.  Brittney Gibson is a 63 y.o. African-American female patient with hypertension, hypercholesterolemia, diabetes mellitus, mild obesity presents for follow-up of palpitations, symptoms started after she received COVID booster on March 21, 2023.  Patient presents for follow-up, states that since last office visit her palpitation symptoms are improved significantly.  States that she is essentially asymptomatic.  She is accompanied by her daughter.  Labs   Lab Results  Component Value Date   CHOL 144 09/30/2023   HDL 52 09/30/2023   LDLCALC 77 09/30/2023   TRIG 78 09/30/2023   CHOLHDL 2.8 09/30/2023   Lab Results  Component Value Date   NA 144 07/04/2013   K 3.6 (L) 07/04/2013   CO2 29 07/04/2013   GLUCOSE 127 (H) 07/04/2013   BUN 15 07/04/2013   CREATININE 0.76 07/04/2013   CALCIUM 9.7 07/04/2013   GFRNONAA >90 07/04/2013      Latest Ref Rng & Units 07/04/2013    2:20 PM 11/24/2012    8:59 PM  BMP  Glucose 70 - 99 mg/dL 956  213   BUN 6 - 23 mg/dL 15  17   Creatinine 0.86 - 1.10 mg/dL 5.78  4.69   Sodium 629 - 147 mEq/L 144  140   Potassium 3.7 - 5.3 mEq/L 3.6  3.4   Chloride 96 - 112 mEq/L 102  102   CO2 19 - 32 mEq/L 29  29   Calcium 8.4 - 10.5 mg/dL 9.7  9.8     External Labs:  PCP labs 04/21/2023:   Hb 11.9/HCT 35.1, platelets 300 68K, normal indicis.   Serum glucose 87 mg, BUN 19, creatinine 0.83, EGFR 78 mL, potassium 3.7.  Magnesium 1.7.   Labs 02/23/2023:   Total cholesterol 175, triglycerides 111, HDL 54, LDL 101.   Labs 09/07/2023:  A1c 7.0%.  Review of Systems  Cardiovascular:  Positive for  palpitations. Negative for chest pain, dyspnea on exertion and leg swelling.   Physical Exam:   VS:  BP 131/70 (BP Location: Left Arm, Patient Position: Sitting, Cuff Size: Large)   Pulse 83   Resp 16   Ht 5\' 4"  (1.626 m)   Wt 180 lb 3.2 oz (81.7 kg)   SpO2 97%   BMI 30.93 kg/m    Wt Readings from Last 3 Encounters:  10/04/23 180 lb 3.2 oz (81.7 kg)  08/23/23 184 lb (83.5 kg)  07/27/23 184 lb 3.2 oz (83.6 kg)    Physical Exam Neck:     Vascular: No carotid bruit or JVD.  Cardiovascular:     Rate and Rhythm: Normal rate and regular rhythm.     Pulses: Intact distal pulses.     Heart sounds: Normal heart sounds. No murmur heard.    No gallop.  Pulmonary:     Effort: Pulmonary effort is normal.     Breath sounds: Normal breath sounds.  Abdominal:     General: Bowel sounds are normal.     Palpations: Abdomen is soft.  Musculoskeletal:     Right lower leg: No edema.     Left lower leg: No edema.  Studies Reviewed: Aaron Aas    PCP performed Zio patch extended EKG monitoring 13 days starting 04/21/2023 (personally reviewed): Predominant underlying rhythm was normal sinus rhythm.  Minimum heart rate 56 bpm at 11:13 AM and maximum heart rate 108 bpm at 1:39 PM. First-degree AV block was present.  Isolated PACs and rare atrial triplets were present. Isolated PVCs were frequent (31%).  There were no ventricular ectopics or triplets.  Ventricular bigeminy and trigeminy was present. Patient activated events correlated with PVCs, ventricular bigeminy and trigeminy.  MYOCARDIAL PERFUSION IMAGING 08/23/2023    The study is normal. The study is low risk.   A Bruce protocol stress test was performed. Exercise capacity was normal. Patient exercised for 6 min and 0 sec. Maximum HR of 151 bpm. MPHR 96.0%. Peak METS 7.0. The patient experienced no angina during the test. The patient achieved the target heart rate. The patient reported dyspnea and fatigue during the stress test. Elevated blood  pressure and normal heart rate response noted during stress. Heart rate recovery was normal.   Arrhythmias during stress: frequent PVCs. Arrhythmias during recovery: frequent PVCs. The ECG was negative for ischemia.   LV perfusion is normal. There is no evidence of ischemia. There is no evidence of infarction.   Left ventricular function is normal. Nuclear stress EF: 50%, gating may have been affected by frequent PVCs, correlate EF with current echo. The left ventricular ejection fraction is mildly decreased (45-54%). End diastolic cavity size is normal. End systolic cavity size is normal. No evidence of transient ischemic dilation (TID) noted.   Prior study not available for comparison.  ECHOCARDIOGRAM COMPLETE 08/23/2023  1. Left ventricular ejection fraction, by estimation, is 60 to 65%. The left ventricle has normal function. The left ventricle has no regional wall motion abnormalities. Left ventricular diastolic parameters are consistent with Grade I diastolic dysfunction (impaired relaxation). The average left ventricular global longitudinal strain is -21.4 %. The global longitudinal strain is normal. 2. Right ventricular systolic function is normal. The right ventricular size is normal. 3. The mitral valve is normal in structure. Trivial mitral valve regurgitation. No evidence of mitral stenosis. 4. The aortic valve is tricuspid. Aortic valve regurgitation is not visualized. No aortic stenosis is present. 5. The inferior vena cava is normal in size with greater than 50% respiratory variability, suggesting right atrial pressure of 3 mmHg.  EKG:         EKG 05/25/2024: Normal sinus rhythm at a rate of 75 bpm, LAE, normal axis, frequent PVCs (4) in bigeminal pattern. No evidence of ischemia, normal QT interval.   Medications and allergies    Allergies  Allergen Reactions   Codeine Anaphylaxis     Current Outpatient Medications:    amLODipine (NORVASC) 10 MG tablet, Take 10 mg by mouth  daily., Disp: , Rfl:    atorvastatin (LIPITOR) 40 MG tablet, Take 1 tablet (40 mg total) by mouth daily., Disp: 90 tablet, Rfl: 0   dorzolamide (TRUSOPT) 2 % ophthalmic solution, Place 1 drop into both eyes 2 (two) times daily., Disp: , Rfl:    losartan-hydrochlorothiazide (HYZAAR) 100-25 MG tablet, Take 1 tablet by mouth daily., Disp: , Rfl:    metFORMIN (GLUCOPHAGE) 1000 MG tablet, Take 1,000 mg by mouth 2 (two) times daily with a meal., Disp: , Rfl:    Multiple Vitamins-Minerals (CENTRUM SILVER 50+WOMEN PO), Take by mouth., Disp: , Rfl:    polyethylene glycol (MIRALAX / GLYCOLAX) 17 g packet, Take 17 g by mouth daily., Disp: , Rfl:  No orders of the defined types were placed in this encounter.    Medications Discontinued During This Encounter  Medication Reason   dorzolamide-timolol (COSOPT) 22.3-6.8 MG/ML ophthalmic solution Change in therapy   aspirin EC 81 MG tablet      ASSESSMENT AND PLAN: .      ICD-10-CM   1. Palpitations  R00.2 ECHOCARDIOGRAM COMPLETE    2. Frequent unifocal PVCs  I49.3 ECHOCARDIOGRAM COMPLETE    3. Nonspecific abnormal electrocardiogram (ECG) (EKG)  R94.31 ECHOCARDIOGRAM COMPLETE    4. Primary hypertension  I10     5. Hypercholesteremia  E78.00       1. Palpitations (Primary) Symptoms of palpitations have improved spontaneously.  Hence I did not start her on a beta-blocker therapy.  Patient is also reluctant on starting any new medication.  In view of frequent PVCs and PVC burden of 30+ percent, I would recommend repeating echocardiogram in a year and I will see her back at that time. - ECHOCARDIOGRAM COMPLETE; Future  2. Frequent unifocal PVCs  - ECHOCARDIOGRAM COMPLETE; Future  3. Nonspecific abnormal electrocardiogram (ECG) (EKG) As dictated above. - ECHOCARDIOGRAM COMPLETE; Future  4. Primary hypertension Blood pressure is well-controlled at 131/70 mmHg with amlodipine and losartan HCT. Continue current antihypertensive medications:  amlodipine 5 mg and losartan HCT 100/25 mg.  5. Hypercholesteremia Cholesterol level is 101 mg/dL, above the target of 70 mg/dL for patients with diabetes.  Upon having doubled up on her atorvastatin to 40 mg, LDL has improved to 76.  Continue high intensity statin.  Lowering cholesterol is crucial to reduce future cerebrovascular and cardiovascular risks. Aim for a 5-10 pound weight loss to help lower cholesterol levels. Continue current cholesterol-lowering medication regimen.  Current weight is 182 pounds with a height of 5'4". The goal is to reduce weight to 170 pounds initially, with an eventual goal of 160 pounds to improve overall health and aid in diabetes and cholesterol management. Encourage regular physical activity and daily walking. Focus on dietary modifications to achieve weight loss goals.  She is diabetic, she would greatly benefit from either Jardiance or Farxiga both for weight loss and for cardiovascular protection as well.  Aspirin Use   Taking aspirin without a clear indication. Discontinue use as there is no current need for aspirin therapy.  Signed,  Knox Perl, MD, [facility] 10/04/2023, 9:28 AM [facility] [address] [place], [place] [address] Phone: [phone]. Fax:  [phone]

## 2024-05-28 ENCOUNTER — Ambulatory Visit: Admitting: Obstetrics and Gynecology

## 2024-08-22 ENCOUNTER — Ambulatory Visit: Admitting: Obstetrics and Gynecology
